# Patient Record
Sex: Female | Born: 1935 | Race: White | Hispanic: No | Smoking: Never smoker
Health system: Southern US, Community
[De-identification: ages and names within clinical notes are randomized; demographics above are authoritative.]

## PROBLEM LIST (undated history)

## (undated) DIAGNOSIS — E119 Type 2 diabetes mellitus without complications: Secondary | ICD-10-CM

## (undated) DIAGNOSIS — E785 Hyperlipidemia, unspecified: Secondary | ICD-10-CM

## (undated) DIAGNOSIS — C439 Malignant melanoma of skin, unspecified: Secondary | ICD-10-CM

## (undated) DIAGNOSIS — H269 Unspecified cataract: Secondary | ICD-10-CM

## (undated) HISTORY — DX: Hyperlipidemia, unspecified: E78.5

## (undated) HISTORY — DX: Malignant melanoma of skin, unspecified: C43.9

## (undated) HISTORY — DX: Type 2 diabetes mellitus without complications: E11.9

## (undated) HISTORY — DX: Unspecified cataract: H26.9

---

## 2014-08-12 HISTORY — PX: CATARACT EXTRACTION: SUR2

## 2014-10-11 DIAGNOSIS — H2513 Age-related nuclear cataract, bilateral: Secondary | ICD-10-CM | POA: Diagnosis not present

## 2014-10-11 DIAGNOSIS — H40033 Anatomical narrow angle, bilateral: Secondary | ICD-10-CM | POA: Diagnosis not present

## 2014-10-25 DIAGNOSIS — H25813 Combined forms of age-related cataract, bilateral: Secondary | ICD-10-CM | POA: Diagnosis not present

## 2014-10-31 DIAGNOSIS — H25811 Combined forms of age-related cataract, right eye: Secondary | ICD-10-CM | POA: Diagnosis not present

## 2014-10-31 DIAGNOSIS — H2513 Age-related nuclear cataract, bilateral: Secondary | ICD-10-CM | POA: Diagnosis not present

## 2014-10-31 DIAGNOSIS — H2511 Age-related nuclear cataract, right eye: Secondary | ICD-10-CM | POA: Diagnosis not present

## 2014-10-31 DIAGNOSIS — Z961 Presence of intraocular lens: Secondary | ICD-10-CM | POA: Diagnosis not present

## 2014-11-28 DIAGNOSIS — H2512 Age-related nuclear cataract, left eye: Secondary | ICD-10-CM | POA: Diagnosis not present

## 2014-11-28 DIAGNOSIS — H2513 Age-related nuclear cataract, bilateral: Secondary | ICD-10-CM | POA: Diagnosis not present

## 2014-11-28 DIAGNOSIS — Z961 Presence of intraocular lens: Secondary | ICD-10-CM | POA: Diagnosis not present

## 2014-11-28 DIAGNOSIS — H25812 Combined forms of age-related cataract, left eye: Secondary | ICD-10-CM | POA: Diagnosis not present

## 2014-12-27 DIAGNOSIS — H43393 Other vitreous opacities, bilateral: Secondary | ICD-10-CM | POA: Diagnosis not present

## 2015-01-12 ENCOUNTER — Ambulatory Visit (INDEPENDENT_AMBULATORY_CARE_PROVIDER_SITE_OTHER): Payer: Medicare Other | Admitting: Primary Care

## 2015-01-12 ENCOUNTER — Encounter (INDEPENDENT_AMBULATORY_CARE_PROVIDER_SITE_OTHER): Payer: Self-pay

## 2015-01-12 ENCOUNTER — Encounter: Payer: Self-pay | Admitting: Primary Care

## 2015-01-12 VITALS — BP 126/72 | HR 74 | Temp 98.1°F | Ht 64.5 in | Wt 171.8 lb

## 2015-01-12 DIAGNOSIS — L989 Disorder of the skin and subcutaneous tissue, unspecified: Secondary | ICD-10-CM | POA: Insufficient documentation

## 2015-01-12 NOTE — Progress Notes (Signed)
   Subjective:    Patient ID: Diane Ali, female    DOB: 08/05/1934, 81 y.o.   MRN: [id number]  HPI  Diane Ali is a 81 year old female who presents today to establish care and discuss the problems mentioned below. She has not been to a PCP in over 10 years therefore does not have a place to obtain old records.  1) Lesion to nose: Present to the left side of her nasal septum for the past 6 months. Initially presented as a sore spot to the nasal septum then gradually scabbed over. The scab will fall off and will regrow. Over the past 6 months her scab has not improved but has not worsened. She has some tenderness when applying pressure. She's tried applying neosporin, alcohol, and some "silver" product without resolve. Her scab does not itch. She is a non smoker. 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     Review of Systems  HENT: Negative for rhinorrhea.   Respiratory: Negative for cough and shortness of breath.   Cardiovascular: Negative for chest pain.  Skin: Positive for wound.  Neurological: Negative for dizziness.       Past Medical History  Diagnosis Date  . Cataracts, bilateral     History   Social History  . Marital Status: Widowed    Spouse Name: N/A  . Number of Children: N/A  . Years of Education: N/A   Occupational History  . Not on file.   Social History Main Topics  . Smoking status: Never Smoker   . Smokeless tobacco: Not on file  . Alcohol Use: No  . Drug Use: Not on file  . Sexual Activity: Not on file   Other Topics Concern  . Not on file   Social History Narrative   Widow   Retired. Once worked in [employer].   Enjoys reading, watching TV, being with her family.   Lives alone.    Past Surgical History  Procedure Laterality Date  . Cataract extraction  2016    Family History  Problem Relation Age of Onset  . Diabetes Father   . Alzheimer's disease Brother     No Known Allergies  No current outpatient  prescriptions on file prior to visit.   No current facility-administered medications on file prior to visit.    BP 126/72 mmHg  Pulse 74  Temp(Src) 98.1 F (36.7 C) (Oral)  Ht 5' 4.5" (1.638 m)  Wt 171 lb 12.8 oz (77.928 kg)  BMI 29.04 kg/m2  SpO2 96%    Objective:   Physical Exam  Constitutional: She appears well-nourished.  Cardiovascular: Normal rate and regular rhythm.   Pulmonary/Chest: Effort normal and breath sounds normal.  Skin: Skin is warm and dry.  1.5 cm lesion present to left side of nasal septum. Dark, scabbing present. Non-tender.          Assessment & Plan:

## 2015-01-12 NOTE — Progress Notes (Signed)
Pre visit review using our clinic review tool, if applicable. No additional management support is needed unless otherwise documented below in the visit note. 

## 2015-01-12 NOTE — Patient Instructions (Signed)
You will be contacted regarding your referral to Dermatology.  Please let us know if you have not heard back within one week.  Please schedule a physical with me in the next 1-2 months. You will also schedule a lab only appointment one week prior. We will discuss your lab results during your physical.  It was a pleasure to meet you today! Please don't hesitate to call me with any questions. Welcome to [facility]!

## 2015-01-12 NOTE — Assessment & Plan Note (Signed)
Present to left sided of nasal septum x 6 months Dark, scabbing. Suspicious for basal cell carcinoma.  Referral made to dermatology for further evaluation. Will continue to monitor.

## 2015-02-07 ENCOUNTER — Other Ambulatory Visit: Payer: Self-pay | Admitting: Primary Care

## 2015-02-07 DIAGNOSIS — Z0000 Encounter for general adult medical examination without abnormal findings: Secondary | ICD-10-CM

## 2015-02-15 ENCOUNTER — Other Ambulatory Visit (INDEPENDENT_AMBULATORY_CARE_PROVIDER_SITE_OTHER): Payer: Medicare Other

## 2015-02-15 DIAGNOSIS — Z0000 Encounter for general adult medical examination without abnormal findings: Secondary | ICD-10-CM

## 2015-02-15 DIAGNOSIS — E785 Hyperlipidemia, unspecified: Secondary | ICD-10-CM

## 2015-02-15 DIAGNOSIS — E559 Vitamin D deficiency, unspecified: Secondary | ICD-10-CM

## 2015-02-15 LAB — COMPREHENSIVE METABOLIC PANEL
ALK PHOS: 47 U/L (ref 39–117)
ALT: 11 U/L (ref 0–35)
AST: 17 U/L (ref 0–37)
Albumin: 3.8 g/dL (ref 3.5–5.2)
BILIRUBIN TOTAL: 0.7 mg/dL (ref 0.2–1.2)
BUN: 13 mg/dL (ref 6–23)
CO2: 28 mEq/L (ref 19–32)
CREATININE: 1.11 mg/dL (ref 0.40–1.20)
Calcium: 9.3 mg/dL (ref 8.4–10.5)
Chloride: 106 mEq/L (ref 96–112)
GFR: 50.44 mL/min — ABNORMAL LOW (ref >60.00)
GLUCOSE: 130 mg/dL — AB (ref 70–99)
Potassium: 3.8 mEq/L (ref 3.5–5.1)
Sodium: 141 mEq/L (ref 135–145)
Total Protein: 6.8 g/dL (ref 6.0–8.3)

## 2015-02-15 LAB — VITAMIN D 25 HYDROXY (VIT D DEFICIENCY, FRACTURES): VITD: 8.18 ng/mL — ABNORMAL LOW (ref 30.00–100.00)

## 2015-02-15 LAB — LIPID PANEL
Cholesterol: 200 mg/dL (ref 0–200)
HDL: 37.6 mg/dL — AB (ref >39.00)
LDL Cholesterol: 141 mg/dL — ABNORMAL HIGH (ref 0–99)
NonHDL: 162.4
TRIGLYCERIDES: 109 mg/dL (ref 0.0–149.0)
Total CHOL/HDL Ratio: 5
VLDL: 21.8 mg/dL (ref 0.0–40.0)

## 2015-02-15 LAB — HEMOGLOBIN A1C: HEMOGLOBIN A1C: 6.9 % — AB (ref 4.6–6.5)

## 2015-02-15 LAB — TSH: TSH: 4.53 u[IU]/mL — ABNORMAL HIGH (ref 0.35–4.50)

## 2015-02-16 LAB — CBC
HCT: 37.4 % (ref 36.0–46.0)
Hemoglobin: 12.5 g/dL (ref 12.0–15.0)
MCHC: 33.5 g/dL (ref 30.0–36.0)
MCV: 91.5 fl (ref 78.0–100.0)
PLATELETS: 181 10*3/uL (ref 150.0–400.0)
RBC: 4.09 Mil/uL (ref 3.87–5.11)
RDW: 13.1 % (ref 11.5–15.5)
WBC: 5.1 10*3/uL (ref 4.0–10.5)

## 2015-02-21 ENCOUNTER — Encounter: Payer: Self-pay | Admitting: Primary Care

## 2015-02-21 ENCOUNTER — Ambulatory Visit (INDEPENDENT_AMBULATORY_CARE_PROVIDER_SITE_OTHER): Payer: Medicare Other | Admitting: Primary Care

## 2015-02-21 VITALS — Ht 65.0 in | Wt 173.4 lb

## 2015-02-21 DIAGNOSIS — E039 Hypothyroidism, unspecified: Secondary | ICD-10-CM | POA: Diagnosis not present

## 2015-02-21 DIAGNOSIS — E119 Type 2 diabetes mellitus without complications: Secondary | ICD-10-CM

## 2015-02-21 DIAGNOSIS — E559 Vitamin D deficiency, unspecified: Secondary | ICD-10-CM | POA: Diagnosis not present

## 2015-02-21 DIAGNOSIS — Z23 Encounter for immunization: Secondary | ICD-10-CM

## 2015-02-21 DIAGNOSIS — E785 Hyperlipidemia, unspecified: Secondary | ICD-10-CM | POA: Diagnosis not present

## 2015-02-21 DIAGNOSIS — L989 Disorder of the skin and subcutaneous tissue, unspecified: Secondary | ICD-10-CM

## 2015-02-21 DIAGNOSIS — Z0000 Encounter for general adult medical examination without abnormal findings: Secondary | ICD-10-CM

## 2015-02-21 MED ORDER — VITAMIN D (ERGOCALCIFEROL) 1.25 MG (50000 UNIT) PO CAPS: ORAL_CAPSULE | ORAL | Status: DC

## 2015-02-21 MED ORDER — ATORVASTATIN CALCIUM 10 MG PO TABS: 10.0000 mg | ORAL_TABLET | Freq: Every day | ORAL | Status: DC

## 2015-02-21 MED ORDER — LEVOTHYROXINE SODIUM 25 MCG PO TABS: 25.0000 ug | ORAL_TABLET | Freq: Every day | ORAL | Status: DC

## 2015-02-21 NOTE — Assessment & Plan Note (Signed)
New diagnosis today. A1C 6.9. Due to age and level will continue to control with education regarding diet. Goal A1C for this patient is <8. Foot exam next visit. Repeat A1C in 3 months.

## 2015-02-21 NOTE — Assessment & Plan Note (Signed)
TSH 4.5 with symptoms of fatigue and memory loss. Will treat with low dose levothyroxine. Will repeat in 3 months during next visit.

## 2015-02-21 NOTE — Assessment & Plan Note (Signed)
Carcinoma to left nares. Following with dermatology currently. Is scheduled for removal next month.

## 2015-02-21 NOTE — Patient Instructions (Addendum)
Underactive thyroid: Start Levothyroxine tablets for underactive thyroid. Take 1 tablet by mouth in the morning with a full glass of water. Do not eat for at least 1 hour after taking this medication.  High Cholesterol: Start Atorvastatin. Take 1 tablet by mouth every night.  Low Vitamin D: Start taking vitamin D capsules. Take 1 capsule by mouth once a week.  It is important that you improve your diet. Please limit carbohydrates in the form of white bread, rice, pasta, cakes, cookies, sugary drinks, etc. Increase your consumption of fresh fruits and vegetables. Be sure to drink plenty of water daily.  Follow up in 3 months for recheck of Diabetes and Thyroid. Call me if you need anything!

## 2015-02-21 NOTE — Progress Notes (Signed)
Pre visit review using our clinic review tool, if applicable. No additional management support is needed unless otherwise documented below in the visit note. 

## 2015-02-21 NOTE — Progress Notes (Signed)
Patient ID: Diane Ali, female   DOB: Feb 07, 1934, 81 y.o.   MRN: [id number]  HPI: [name] is a 81 year old female who presents today for subsequent medicare wellness visit.  Past Medical History  Diagnosis Date  . Cataracts, bilateral     No current outpatient prescriptions on file.   No current facility-administered medications for this visit.    No Known Allergies  Family History  Problem Relation Age of Onset  . Diabetes Father   . Alzheimer's disease Brother     History   Social History  . Marital Status: Widowed    Spouse Name: N/A  . Number of Children: N/A  . Years of Education: N/A   Occupational History  . Not on file.   Social History Main Topics  . Smoking status: Never Smoker   . Smokeless tobacco: Not on file  . Alcohol Use: No  . Drug Use: Not on file  . Sexual Activity: Not on file   Other Topics Concern  . Not on file   Social History Narrative   Widow   Retired. Once worked in [employer].   Enjoys reading, watching TV, being with her family.   Lives alone.    Hospitiliaztions: None.  Health Maintenance:    Flu: Did not receive last season.   Tetanus: Unsure. Has not received medication care in over 20 years.  Pneumovax: Has never completed.  Zostavax: Has never completed.  Bone Density: Has not completed.  Colonoscopy: Has never completed.  Eye Doctor: March 2016 for glaucoma testing.  Dental Exam: Has not been recently.    I have personally reviewed and have noted: 1. The patient's medical and social history 2. Their use of alcohol, tobacco or illicit drugs. None. 3. Their current medications and supplements 4. The patient's functional ability including ADL's, fall risks, home safety risks and hearing or visual  impairment. 5. Diet and physical activities 6. Evidence for depression or mood disorder  Subjective:   Review of Systems:   Constitutional: Denies fever, malaise, fatigue, headache or abrupt weight changes.   HEENT: Denies eye pain, eye redness, ear pain, ringing in the ears, wax buildup, runny nose, nasal congestion, bloody nose, or sore throat. Respiratory: Denies difficulty breathing, shortness of breath, cough or sputum production.   Cardiovascular: Denies chest pain, chest tightness, palpitations or swelling in the hands or feet.  Gastrointestinal: Denies abdominal pain, bloating, constipation, diarrhea or blood in the stool.  GU: Denies urgency, frequency, pain with urination, burning sensation, blood in urine, odor or discharge. Musculoskeletal: Denies decrease in range of motion, difficulty with gait, muscle pain or joint pain and swelling.  Skin: Denies redness, rashes. Lesion to left nares which is under current treatment. Neurological: Denies dizziness, difficulty with memory, difficulty with speech or problems with balance and coordination.   No other specific complaints in a complete review of systems (except as listed in HPI above).  Objective:  PE:   Ht 5\' 5"  (1.651 m)  Wt 173 lb 6.4 oz (78.654 kg)  BMI 28.86 kg/m2 Wt Readings from Last 3 Encounters:  02/21/15 173 lb 6.4 oz (78.654 kg)  01/12/15 171 lb 12.8 oz (77.928 kg)    General: Appears their stated age, well developed, well nourished in NAD. Skin: Warm, dry. Lesion to left nares which has been determined to be cancerous. She currently undergoing treatment with dermatology. HEENT: Head: normal shape and size; Eyes: sclera white, no icterus, conjunctiva pink, PERRLA and EOMs intact; Ears: Tm's  gray and intact, normal light reflex; Nose: mucosa pink and moist, septum midline; Throat/Mouth: Teeth present, mucosa pink and moist, no exudate, lesions or ulcerations noted.  Neck: Normal range of motion. Neck supple, trachea midline. No massses, lumps or thyromegaly present.  Cardiovascular: Normal rate and rhythm. S1,S2 noted.  No murmur, rubs or gallops noted. No JVD or BLE edema. No carotid bruits noted. Pulmonary/Chest:  Normal effort and positive vesicular breath sounds. No respiratory distress. No wheezes, rales or ronchi noted.  Abdomen: Soft and nontender. Normal bowel sounds, no bruits noted. No distention or masses noted. Liver, spleen and kidneys non palpable. Musculoskeletal: Normal range of motion. No signs of joint swelling. No difficulty with gait.  Neurological: Alert and oriented. Cranial nerves II-XII intact. Coordination normal. +DTRs bilaterally. Psychiatric: Mood and affect normal. Behavior is normal. Judgment and thought content normal.   EKG:   BMET    Component Value Date/Time   NA 141 02/15/2015 1202   K 3.8 02/15/2015 1202   CL 106 02/15/2015 1202   CO2 28 02/15/2015 1202   GLUCOSE 130* 02/15/2015 1202   BUN 13 02/15/2015 1202   CREATININE 1.11 02/15/2015 1202   CALCIUM 9.3 02/15/2015 1202    Lipid Panel     Component Value Date/Time   CHOL 200 02/15/2015 1202   TRIG 109.0 02/15/2015 1202   HDL 37.60* 02/15/2015 1202   CHOLHDL 5 02/15/2015 1202   VLDL 21.8 02/15/2015 1202   LDLCALC 141* 02/15/2015 1202    CBC    Component Value Date/Time   WBC 5.1 02/15/2015 1202   RBC 4.09 02/15/2015 1202   HGB 12.5 02/15/2015 1202   HCT 37.4 02/15/2015 1202   PLT 181.0 02/15/2015 1202   MCV 91.5 02/15/2015 1202   MCHC 33.5 02/15/2015 1202   RDW 13.1 02/15/2015 1202    Hgb A1C Lab Results  Component Value Date   HGBA1C 6.9* 02/15/2015      Assessment and Plan:   Medicare Annual Wellness Visit:  Diet: Consists of sandwiches, TV dinners, cookies, ice cream. Limited fruits and vegetables. Drinking mostly diet sprite, water.  Physical activity: Active around her house. Depression/mood screen: Negative Hearing: Intact to whispered voice Visual acuity: Grossly normal, performs annual eye exam  ADLs: Capable Fall risk: None Home safety: Good Cognitive evaluation: Intact to orientation, naming, recall and repetition EOL planning: Has not discussed with family. Packet  provided.  Preventative Medicine: Tdap and Prevnar vaccines provided today. Will provide shingles vaccine next visit in 3 months. Bone density testing ordered. Due to age will not have patient undergo colonoscopy. Discussed diabetic diet and to reduce carbohydrates and sugars. Provided patient and family a list of recommendations for preventative treatment.  Next appointment: 3 months for re-evaluation of diabetes and thyroid function.   Providers: Alma Friendly, AGNP-C. PCP. Dermatology.

## 2015-02-21 NOTE — Assessment & Plan Note (Signed)
LDL 141 which is above goal, especially given the diabetes. Will treat with moderate intensity statin. Will recheck in 3 months.

## 2015-02-21 NOTE — Assessment & Plan Note (Signed)
Start prescription weekly vitamin d. Will recheck in 12 weeks. Dexa scan ordered.

## 2015-02-21 NOTE — Assessment & Plan Note (Signed)
Tdap and Prevnar provided today. Zostavax due next visit. Discussed importance of healthy diet due to elevated cholesterol and diabetes diagnosis. No falls or hospitalizations. Lives at home by herself. Packet provided regarding advance directives.

## 2015-03-01 ENCOUNTER — Ambulatory Visit
Admission: RE | Admit: 2015-03-01 | Discharge: 2015-03-01 | Disposition: A | Discharge status: Home / Self Care | Payer: Medicare Other | Source: Ambulatory Visit | Attending: Primary Care | Admitting: Primary Care

## 2015-03-01 ENCOUNTER — Telehealth: Payer: Self-pay | Admitting: Primary Care

## 2015-03-01 DIAGNOSIS — E559 Vitamin D deficiency, unspecified: Secondary | ICD-10-CM | POA: Diagnosis present

## 2015-03-01 DIAGNOSIS — Z780 Asymptomatic menopausal state: Secondary | ICD-10-CM | POA: Insufficient documentation

## 2015-03-01 NOTE — Telephone Encounter (Signed)
Diane Ali returned your call

## 2015-03-01 NOTE — Telephone Encounter (Signed)
Called and notified patient's daughter of Kate's comments. Patient's daughter verbalized understanding. 

## 2015-04-03 ENCOUNTER — Emergency Department (HOSPITAL_COMMUNITY): Payer: Medicare Other

## 2015-04-03 ENCOUNTER — Encounter (HOSPITAL_COMMUNITY): Payer: Self-pay | Admitting: *Deleted

## 2015-04-03 ENCOUNTER — Emergency Department (HOSPITAL_COMMUNITY)
Admission: EM | Admit: 2015-04-03 | Discharge: 2015-04-04 | Disposition: A | Discharge status: Home / Self Care | Payer: Medicare Other | Attending: Emergency Medicine | Admitting: Emergency Medicine

## 2015-04-03 DIAGNOSIS — Z79899 Other long term (current) drug therapy: Secondary | ICD-10-CM | POA: Diagnosis not present

## 2015-04-03 DIAGNOSIS — E860 Dehydration: Secondary | ICD-10-CM | POA: Diagnosis not present

## 2015-04-03 DIAGNOSIS — M545 Low back pain: Secondary | ICD-10-CM | POA: Diagnosis not present

## 2015-04-03 DIAGNOSIS — R112 Nausea with vomiting, unspecified: Secondary | ICD-10-CM

## 2015-04-03 DIAGNOSIS — N289 Disorder of kidney and ureter, unspecified: Secondary | ICD-10-CM

## 2015-04-03 DIAGNOSIS — G8929 Other chronic pain: Secondary | ICD-10-CM | POA: Diagnosis not present

## 2015-04-03 DIAGNOSIS — M25552 Pain in left hip: Secondary | ICD-10-CM | POA: Insufficient documentation

## 2015-04-03 LAB — COMPREHENSIVE METABOLIC PANEL
ALK PHOS: 49 U/L (ref 38–126)
ALT: 16 U/L (ref 14–54)
AST: 25 U/L (ref 15–41)
Albumin: 4.1 g/dL (ref 3.5–5.0)
Anion gap: 9 (ref 5–15)
BUN: 27 mg/dL — ABNORMAL HIGH (ref 6–20)
CALCIUM: 9.8 mg/dL (ref 8.9–10.3)
CO2: 23 mmol/L (ref 22–32)
CREATININE: 1.84 mg/dL — AB (ref 0.44–1.00)
Chloride: 107 mmol/L (ref 101–111)
GFR calc non Af Amer: 25 mL/min — ABNORMAL LOW (ref >60)
GFR, EST AFRICAN AMERICAN: 29 mL/min — AB (ref >60)
Glucose, Bld: 168 mg/dL — ABNORMAL HIGH (ref 65–99)
Potassium: 4.7 mmol/L (ref 3.5–5.1)
SODIUM: 139 mmol/L (ref 135–145)
Total Bilirubin: 0.8 mg/dL (ref 0.3–1.2)
Total Protein: 7.4 g/dL (ref 6.5–8.1)

## 2015-04-03 LAB — CBC
HCT: 37.4 % (ref 36.0–46.0)
Hemoglobin: 12.6 g/dL (ref 12.0–15.0)
MCH: 30.6 pg (ref 26.0–34.0)
MCHC: 33.7 g/dL (ref 30.0–36.0)
MCV: 90.8 fL (ref 78.0–100.0)
PLATELETS: 186 10*3/uL (ref 150–400)
RBC: 4.12 MIL/uL (ref 3.87–5.11)
RDW: 13.3 % (ref 11.5–15.5)
WBC: 6.8 10*3/uL (ref 4.0–10.5)

## 2015-04-03 LAB — I-STAT VENOUS BLOOD GAS, ED
ACID-BASE DEFICIT: 2 mmol/L (ref 0.0–2.0)
Bicarbonate: 22.7 mEq/L (ref 20.0–24.0)
O2 Saturation: 44 %
PO2 VEN: 25 mmHg — AB (ref 30.0–45.0)
TCO2: 24 mmol/L (ref 0–100)
pCO2, Ven: 39.6 mmHg — ABNORMAL LOW (ref 45.0–50.0)
pH, Ven: 7.367 — ABNORMAL HIGH (ref 7.250–7.300)

## 2015-04-03 LAB — SALICYLATE LEVEL

## 2015-04-03 MED ORDER — SODIUM CHLORIDE 0.9 % IV BOLUS (SEPSIS)
1000.0000 mL | Freq: Once | INTRAVENOUS | Status: AC
  Administered 2015-04-04: 1000 mL via INTRAVENOUS

## 2015-04-03 MED ORDER — SODIUM CHLORIDE 0.9 % IV BOLUS (SEPSIS)
500.0000 mL | Freq: Once | INTRAVENOUS | Status: AC
  Administered 2015-04-04: 500 mL via INTRAVENOUS

## 2015-04-03 NOTE — ED Notes (Addendum)
Pt family reports that pt has taken 12 advil since Saturday night. Pt reports having nausea and vomiting. Family concerned with the amount that she took. Pt reports left hip pain. Pt denies falls or injuries. Pt family reports some confusion at baseline.

## 2015-04-03 NOTE — ED Provider Notes (Signed)
CSN: [id number]   Arrival date & time 04/03/15 8101  History  This chart was scribed for Ripley Fraise, MD by Altamease Oiler, ED Scribe. This patient was seen in room B14C/B14C and the patient's care was started at 11:16 PM.  Chief Complaint  Patient presents with  . Emesis    HPI Patient is a 81 y.o. female presenting with vomiting. The history is provided by the patient and a relative. No language interpreter was used.  Emesis Severity:  Moderate Duration:  1 day Timing:  Intermittent Quality:  Stomach contents Progression:  Unchanged Chronicity:  New Recent urination:  Normal Context: not post-tussive and not self-induced   Relieved by:  Nothing Worsened by:  Nothing tried Ineffective treatments:  None tried Associated symptoms: arthralgias (chronic left hip)   Associated symptoms: no abdominal pain and no headaches     Diane Ali is a 81 y.o. female who presents to the Emergency Department complaining of nausea and  emesis with  onset yesterday. Her daughter associates the symptoms with use of Advil since nose surgery. Over the previous 2 days the pt used 12 Advil tablets (last dose around 5 PM yesterday) and today she used 1 dose of Tylenol for pain. Also complains of chronic back and left hip pain. Pt denies fever, nasal pain, headache, chest pain, abdominal pain, dysuria, diarrhea, LE pain, recent fall, hematemesis, and hematochezia .   Past Medical History  Diagnosis Date  . Cataracts, bilateral     Past Surgical History  Procedure Laterality Date  . Cataract extraction  2016    Family History  Problem Relation Age of Onset  . Diabetes Father   . Alzheimer's disease Brother     Social History  Substance Use Topics  . Smoking status: Never Smoker   . Smokeless tobacco: None  . Alcohol Use: No     Review of Systems  Constitutional: Negative for fever.  Cardiovascular: Negative for chest pain.  Gastrointestinal: Positive for vomiting. Negative for  abdominal pain and blood in stool.  Genitourinary: Negative for dysuria.  Musculoskeletal: Positive for back pain (chronic) and arthralgias (chronic left hip).  Neurological: Negative for headaches.  All other systems reviewed and are negative.   Home Medications   Prior to Admission medications   Medication Sig Start Date End Date Taking? Authorizing Provider  acetaminophen (TYLENOL) 500 MG tablet Take 1,000 mg by mouth every 6 (six) hours as needed for moderate pain.    Yes Historical Provider, MD  atorvastatin (LIPITOR) 10 MG tablet Take 1 tablet (10 mg total) by mouth daily. 02/21/15  Yes Pleas Koch, NP  ibuprofen (ADVIL,MOTRIN) 200 MG tablet Take 400 mg by mouth every 6 (six) hours as needed for moderate pain.    Yes Historical Provider, MD  levothyroxine (LEVOTHROID) 25 MCG tablet Take 1 tablet (25 mcg total) by mouth daily before breakfast. 02/21/15  Yes Pleas Koch, NP  Vitamin D, Ergocalciferol, (DRISDOL) 50000 UNITS CAPS capsule Take 1 capsule by mouth once weekly. 02/21/15  Yes Pleas Koch, NP    Allergies  Review of patient's allergies indicates no known allergies.  Triage Vitals: BP 157/75 mmHg  Pulse 72  Temp(Src) 98 F (36.7 C) (Oral)  Resp 16  SpO2 100% Physical Exam  Nursing note reviewed. CONSTITUTIONAL: Well developed/well nourished HEAD: Normocephalic/atraumatic EYES: EOMI/PERRL ENMT: Mucous membranes moist, healing surgical incision to nose with no erythema or drainage NECK: supple no meningeal signs SPINE/BACK:entire spine nontender CV: S1/S2 noted, no murmurs/rubs/gallops noted  LUNGS: Lungs are clear to auscultation bilaterally, no apparent distress ABDOMEN: soft, nontender, no rebound or guarding, bowel sounds noted throughout abdomen GU:no cva tenderness NEURO: Pt is awake/alert/appropriate, moves all extremitiesx4.  No facial droop.   EXTREMITIES: pulses normal/equal, full ROM, no deformity to lower extremities, no tenderness with ROM  of hips SKIN: warm, color normal PSYCH: no abnormalities of mood noted, alert and oriented to situation   ED Course  Procedures   COORDINATION OF CARE: 11:24 PM Discussed treatment plan which includes lab work, left hip XR, and IVF with pt at bedside and pt agreed to plan.  1:38 AM Pt improved Mild renal insufficiency Advised stopping NSAIDs Increase fluids Recheck CR in one week with PCP Pt is at baseline Had some left hip pain but xray negative and she is reporting she is ambulating I feel she is safe for d/c home  Labs Reviewed  COMPREHENSIVE METABOLIC PANEL - Abnormal; Notable for the following:    Glucose, Bld 168 (*)    BUN 27 (*)    Creatinine, Ser 1.84 (*)    GFR calc non Af Amer 25 (*)    GFR calc Af Amer 29 (*)    All other components within normal limits  ACETAMINOPHEN LEVEL - Abnormal; Notable for the following:    Acetaminophen (Tylenol), Serum <10 (*)    All other components within normal limits  I-STAT VENOUS BLOOD GAS, ED - Abnormal; Notable for the following:    pH, Ven 7.367 (*)    pCO2, Ven 39.6 (*)    pO2, Ven 25.0 (*)    All other components within normal limits  CBC  SALICYLATE LEVEL  BLOOD GAS, VENOUS    Imaging Review Dg Hip Unilat With Pelvis 2-3 Views Left  04/03/2015   CLINICAL DATA:  Left hip pain, low back pain  EXAM: DG HIP (WITH OR WITHOUT PELVIS) 2-3V LEFT  COMPARISON:  None.  FINDINGS: Three views of the left hip submitted. No acute fracture or subluxation. Mild degenerative changes with spurring of greater femoral trochanter. Degenerative changes pubic symphysis. Degenerative changes bilateral SI joints.  IMPRESSION: No acute fracture or subluxation. Degenerative changes as described above   Electronically Signed   By: Lahoma Crocker M.D.   On: 04/03/2015 19:53   I, Ripley Fraise, MD, personally reviewed and evaluated these images and lab results as part of my medical decision-making.    EKG Interpretation  Date/Time:  Monday April 03 2015 23:46:14 EDT Ventricular Rate:  66 PR Interval:  133 QRS Duration: 78 QT Interval:  410 QTC Calculation: 430 R Axis:   -39 Text Interpretation:  Sinus rhythm Left axis deviation Low voltage, precordial leads Baseline wander in lead(s) I No previous ECGs available Confirmed by Christy Gentles  MD, Elenore Rota ([id number]) on 04/04/2015 12:11:15 AM            MDM   Final diagnoses:  Non-intractable vomiting with nausea, vomiting of unspecified type  Dehydration  Renal insufficiency  Pain in left hip     Nursing notes including past medical history and social history reviewed and considered in documentation xrays/imaging reviewed by myself and considered during evaluation Labs/vital reviewed myself and considered during evaluation   I personally performed the services described in this documentation, which was scribed in my presence. The recorded information has been reviewed and is accurate.     Ripley Fraise, MD 04/04/15 [phone]

## 2015-04-04 ENCOUNTER — Telehealth: Payer: Self-pay

## 2015-04-04 LAB — ACETAMINOPHEN LEVEL

## 2015-04-04 NOTE — Telephone Encounter (Signed)
Noted. Will discuss at upcoming appointment.

## 2015-04-04 NOTE — ED Notes (Signed)
Discharge instructions provided to patient/daughter. Understanding verbalized. Denies pain. Patient refused wheelchair stating "I want to walk out." Daughter reports patient is able to ambulate independently. Daughter with patient at discharge. No distress noted.

## 2015-04-04 NOTE — Telephone Encounter (Signed)
Chrys Racer pts daughter (DPR signed) left v/m; Chrys Racer is concerned that pt needs to be placed in a facility (assisted living?); Chrys Racer does not think pt is taking med correctly; pts memory is worse when pt gets anxious. Chrys Racer said putting pts med in daily med box but pt still has access to OTC meds such as Advil and pt has constant hip pain. Chrys Racer wants to know how to start looking for a place for her mother to stay. Spoke with Chrys Racer and advised usually family members ck with different facilities and see where family and pt would be comfortable with pts residing. Then would get paperwork from that facility. Chrys Racer wants to know if ins will pay for facility and advised Chrys Racer to contact pts ins co for coverage guidelines. Chrys Racer voiced understanding and will cb if needed prior to 30 min appt that is already scheduled for 04/10/15 for F/U ED visit. Chrys Racer asked this note go to [name] NP as Juluis Rainier.

## 2015-04-04 NOTE — Discharge Instructions (Signed)

## 2015-04-05 ENCOUNTER — Emergency Department (HOSPITAL_COMMUNITY)
Admission: EM | Admit: 2015-04-05 | Discharge: 2015-04-05 | Disposition: A | Discharge status: Home / Self Care | Payer: Medicare Other | Attending: Emergency Medicine | Admitting: Emergency Medicine

## 2015-04-05 ENCOUNTER — Emergency Department (HOSPITAL_COMMUNITY): Payer: Medicare Other

## 2015-04-05 ENCOUNTER — Encounter (HOSPITAL_COMMUNITY): Payer: Self-pay | Admitting: Emergency Medicine

## 2015-04-05 DIAGNOSIS — R109 Unspecified abdominal pain: Secondary | ICD-10-CM

## 2015-04-05 DIAGNOSIS — Z8669 Personal history of other diseases of the nervous system and sense organs: Secondary | ICD-10-CM | POA: Insufficient documentation

## 2015-04-05 DIAGNOSIS — Z79899 Other long term (current) drug therapy: Secondary | ICD-10-CM | POA: Insufficient documentation

## 2015-04-05 DIAGNOSIS — N390 Urinary tract infection, site not specified: Secondary | ICD-10-CM | POA: Diagnosis not present

## 2015-04-05 DIAGNOSIS — N200 Calculus of kidney: Secondary | ICD-10-CM

## 2015-04-05 DIAGNOSIS — R10A0 Flank pain, unspecified side: Secondary | ICD-10-CM

## 2015-04-05 LAB — COMPREHENSIVE METABOLIC PANEL
ALBUMIN: 3.6 g/dL (ref 3.5–5.0)
ALK PHOS: 46 U/L (ref 38–126)
ALT: 13 U/L — AB (ref 14–54)
ANION GAP: 9 (ref 5–15)
AST: 19 U/L (ref 15–41)
BILIRUBIN TOTAL: 0.6 mg/dL (ref 0.3–1.2)
BUN: 23 mg/dL — ABNORMAL HIGH (ref 6–20)
CALCIUM: 9.3 mg/dL (ref 8.9–10.3)
CO2: 22 mmol/L (ref 22–32)
CREATININE: 1.34 mg/dL — AB (ref 0.44–1.00)
Chloride: 105 mmol/L (ref 101–111)
GFR calc Af Amer: 43 mL/min — ABNORMAL LOW (ref >60)
GFR calc non Af Amer: 37 mL/min — ABNORMAL LOW (ref >60)
GLUCOSE: 173 mg/dL — AB (ref 65–99)
Potassium: 4.5 mmol/L (ref 3.5–5.1)
Sodium: 136 mmol/L (ref 135–145)
TOTAL PROTEIN: 6.3 g/dL — AB (ref 6.5–8.1)

## 2015-04-05 LAB — URINE MICROSCOPIC-ADD ON

## 2015-04-05 LAB — CBC WITH DIFFERENTIAL/PLATELET
BASOS PCT: 0 % (ref 0–1)
Basophils Absolute: 0 10*3/uL (ref 0.0–0.1)
Eosinophils Absolute: 0 10*3/uL (ref 0.0–0.7)
Eosinophils Relative: 0 % (ref 0–5)
HEMATOCRIT: 34.8 % — AB (ref 36.0–46.0)
HEMOGLOBIN: 11.5 g/dL — AB (ref 12.0–15.0)
LYMPHS ABS: 1.7 10*3/uL (ref 0.7–4.0)
Lymphocytes Relative: 22 % (ref 12–46)
MCH: 29.9 pg (ref 26.0–34.0)
MCHC: 33 g/dL (ref 30.0–36.0)
MCV: 90.4 fL (ref 78.0–100.0)
MONOS PCT: 7 % (ref 3–12)
Monocytes Absolute: 0.5 10*3/uL (ref 0.1–1.0)
NEUTROS ABS: 5.6 10*3/uL (ref 1.7–7.7)
NEUTROS PCT: 71 % (ref 43–77)
Platelets: 194 10*3/uL (ref 150–400)
RBC: 3.85 MIL/uL — AB (ref 3.87–5.11)
RDW: 13.3 % (ref 11.5–15.5)
WBC: 7.9 10*3/uL (ref 4.0–10.5)

## 2015-04-05 LAB — URINALYSIS, ROUTINE W REFLEX MICROSCOPIC
BILIRUBIN URINE: NEGATIVE
GLUCOSE, UA: NEGATIVE mg/dL
KETONES UR: NEGATIVE mg/dL
Nitrite: NEGATIVE
PROTEIN: 100 mg/dL — AB
Specific Gravity, Urine: 1.022 (ref 1.005–1.030)
Urobilinogen, UA: 0.2 mg/dL (ref 0.0–1.0)
pH: 5.5 (ref 5.0–8.0)

## 2015-04-05 MED ORDER — DEXTROSE 5 % IV SOLN
1.0000 g | Freq: Once | INTRAVENOUS | Status: AC
  Administered 2015-04-05: 1 g via INTRAVENOUS
  Filled 2015-04-05: qty 10

## 2015-04-05 MED ORDER — SODIUM CHLORIDE 0.9 % IV BOLUS (SEPSIS)
1000.0000 mL | Freq: Once | INTRAVENOUS | Status: AC
Start: 2015-04-05 — End: 2015-04-05
  Administered 2015-04-05: 1000 mL via INTRAVENOUS

## 2015-04-05 MED ORDER — CEPHALEXIN 500 MG PO CAPS: 500.0000 mg | ORAL_CAPSULE | Freq: Two times a day (BID) | ORAL | Status: DC

## 2015-04-05 MED ORDER — OXYCODONE-ACETAMINOPHEN 5-325 MG PO TABS: 1.0000 | ORAL_TABLET | Freq: Four times a day (QID) | ORAL | Status: DC | PRN

## 2015-04-05 NOTE — ED Notes (Signed)
Family member sts she noticed pt urine was cloudy yesterday

## 2015-04-05 NOTE — ED Notes (Signed)
Pt seen yesterday for nv. Today pt c/o L flank and L abdominal pain. Pt with hx of dementia and has had kidney stones in the past.

## 2015-04-05 NOTE — Discharge Instructions (Signed)
You were seen today for back and abdominal pain. It looks as though you may have recently passed kidney stone. You also have evidence of a urinary tract infection. He will be given antibiotics. You'll be discharged with pain medicines and antibiotics. You should follow-up with her primary doctor. You'll be given urology referral if needed.  Urinary Tract Infection Urinary tract infections (UTIs) can develop anywhere along your urinary tract. Your urinary tract is your body's drainage system for removing wastes and extra water. Your urinary tract includes two kidneys, two ureters, a bladder, and a urethra. Your kidneys are a pair of bean-shaped organs. Each kidney is about the size of your fist. They are located below your ribs, one on each side of your spine. CAUSES Infections are caused by microbes, which are microscopic organisms, including fungi, viruses, and bacteria. These organisms are so small that they can only be seen through a microscope. Bacteria are the microbes that most commonly cause UTIs. SYMPTOMS  Symptoms of UTIs may vary by age and gender of the patient and by the location of the infection. Symptoms in young women typically include a frequent and intense urge to urinate and a painful, burning feeling in the bladder or urethra during urination. Older women and men are more likely to be tired, shaky, and weak and have muscle aches and abdominal pain. A fever may mean the infection is in your kidneys. Other symptoms of a kidney infection include pain in your back or sides below the ribs, nausea, and vomiting. DIAGNOSIS To diagnose a UTI, your caregiver will ask you about your symptoms. Your caregiver also will ask to provide a urine sample. The urine sample will be tested for bacteria and white blood cells. White blood cells are made by your body to help fight infection. TREATMENT  Typically, UTIs can be treated with medication. Because most UTIs are caused by a bacterial infection, they  usually can be treated with the use of antibiotics. The choice of antibiotic and length of treatment depend on your symptoms and the type of bacteria causing your infection. HOME CARE INSTRUCTIONS  If you were prescribed antibiotics, take them exactly as your caregiver instructs you. Finish the medication even if you feel better after you have only taken some of the medication.  Drink enough water and fluids to keep your urine clear or pale yellow.  Avoid caffeine, tea, and carbonated beverages. They tend to irritate your bladder.  Empty your bladder often. Avoid holding urine for long periods of time.  Empty your bladder before and after sexual intercourse.  After a bowel movement, women should cleanse from front to back. Use each tissue only once. SEEK MEDICAL CARE IF:   You have back pain.  You develop a fever.  Your symptoms do not begin to resolve within 3 days. SEEK IMMEDIATE MEDICAL CARE IF:   You have severe back pain or lower abdominal pain.  You develop chills.  You have nausea or vomiting.  You have continued burning or discomfort with urination. MAKE SURE YOU:   Understand these instructions.  Will watch your condition.  Will get help right away if you are not doing well or get worse. Document Released: 05/08/2005 Document Revised: 01/28/2012 Document Reviewed: 09/06/2011 [facility] Patient Information 2015 [place], [place]. This information is not intended to replace advice given to you by your health care provider. Make sure you discuss any questions you have with your health care provider. Kidney Stones Kidney stones (urolithiasis) are deposits that form inside  your kidneys. The intense pain is caused by the stone moving through the urinary tract. When the stone moves, the ureter goes into spasm around the stone. The stone is usually passed in the urine.  CAUSES   A disorder that makes certain neck glands produce too much parathyroid hormone (primary  hyperparathyroidism).  A buildup of uric acid crystals, similar to gout in your joints.  Narrowing (stricture) of the ureter.  A kidney obstruction present at birth (congenital obstruction).  Previous surgery on the kidney or ureters.  Numerous kidney infections. SYMPTOMS   Feeling sick to your stomach (nauseous).  Throwing up (vomiting).  Blood in the urine (hematuria).  Pain that usually spreads (radiates) to the groin.  Frequency or urgency of urination. DIAGNOSIS   Taking a history and physical exam.  Blood or urine tests.  CT scan.  Occasionally, an examination of the inside of the urinary bladder (cystoscopy) is performed. TREATMENT   Observation.  Increasing your fluid intake.  Extracorporeal shock wave lithotripsy--This is a noninvasive procedure that uses shock waves to break up kidney stones.  Surgery may be needed if you have severe pain or persistent obstruction. There are various surgical procedures. Most of the procedures are performed with the use of small instruments. Only small incisions are needed to accommodate these instruments, so recovery time is minimized. The size, location, and chemical composition are all important variables that will determine the proper choice of action for you. Talk to your health care provider to better understand your situation so that you will minimize the risk of injury to yourself and your kidney.  HOME CARE INSTRUCTIONS   Drink enough water and fluids to keep your urine clear or pale yellow. This will help you to pass the stone or stone fragments.  Strain all urine through the provided strainer. Keep all particulate matter and stones for your health care provider to see. The stone causing the pain may be as small as a grain of salt. It is very important to use the strainer each and every time you pass your urine. The collection of your stone will allow your health care provider to analyze it and verify that a stone has  actually passed. The stone analysis will often identify what you can do to reduce the incidence of recurrences.  Only take over-the-counter or prescription medicines for pain, discomfort, or fever as directed by your health care provider.  Make a follow-up appointment with your health care provider as directed.  Get follow-up X-rays if required. The absence of pain does not always mean that the stone has passed. It may have only stopped moving. If the urine remains completely obstructed, it can cause loss of kidney function or even complete destruction of the kidney. It is your responsibility to make sure X-rays and follow-ups are completed. Ultrasounds of the kidney can show blockages and the status of the kidney. Ultrasounds are not associated with any radiation and can be performed easily in a matter of minutes. SEEK MEDICAL CARE IF:  You experience pain that is progressive and unresponsive to any pain medicine you have been prescribed. SEEK IMMEDIATE MEDICAL CARE IF:   Pain cannot be controlled with the prescribed medicine.  You have a fever or shaking chills.  The severity or intensity of pain increases over 18 hours and is not relieved by pain medicine.  You develop a new onset of abdominal pain.  You feel faint or pass out.  You are unable to urinate. MAKE SURE YOU:  Understand these instructions.  Will watch your condition.  Will get help right away if you are not doing well or get worse. Document Released: 07/29/2005 Document Revised: 03/31/2013 Document Reviewed: 12/30/2012 [facility] Patient Information 2015 [place], [place]. This information is not intended to replace advice given to you by your health care provider. Make sure you discuss any questions you have with your health care provider.

## 2015-04-05 NOTE — ED Provider Notes (Signed)
Patient brought back prescription for oxycodone-acetaminophen. Was not able to be filled because it was not signed. Unsigned prescription was taken and she is given a new prescription for oxycodone-acetaminophen 5-325 #10.  Delora Fuel, MD [phone]

## 2015-04-05 NOTE — ED Provider Notes (Signed)
CSN: [id number]     Arrival date & time 04/05/15  0013 History  This chart was scribed for Diane Hacker, MD by Meriel Pica, ED Scribe. This patient was seen in room A03C/A03C and the patient's care was started 3:10 AM.   Chief Complaint  Patient presents with  . Flank Pain   The history is provided by the patient and a relative. No language interpreter was used.   HPI Comments: Diane Ali is a 81 y.o. female, with a PMhx of dementia and renal calculi, who presents to the Emergency Department complaining of sudden onset, intermittent, moderate left-sided abdominal pain that radiates to her left flank onset today. Daughter reports pt has also been mildly nauseous today. The pt was evaluated in the ED 1 day ago for nausea and vomiting that began 3 days ago. She stated yesterday that she has been heavily taking Advil for her additional complaint of chronic back pain and left hip pain. The pt had unremarkable imaging but labs showed mild renal insufficiency on this visit. She was stable for discharge and advised to discontinue use of NSAIDs. Denies right-sided pain, changes in bowel or bladder, urinary symptoms, vomiting since last ED visit, or fevers.   Past Medical History  Diagnosis Date  . Cataracts, bilateral    Past Surgical History  Procedure Laterality Date  . Cataract extraction  2016   Family History  Problem Relation Age of Onset  . Diabetes Father   . Alzheimer's disease Brother    Social History  Substance Use Topics  . Smoking status: Never Smoker   . Smokeless tobacco: None  . Alcohol Use: No   OB History    No data available     Review of Systems  Constitutional: Negative for fever.  Respiratory: Negative for chest tightness and shortness of breath.   Cardiovascular: Negative for chest pain.  Gastrointestinal: Positive for nausea and abdominal pain ( left-sided ). Negative for vomiting.  Genitourinary: Positive for flank pain ( left). Negative for  dysuria, urgency, frequency and hematuria.  All other systems reviewed and are negative.  Allergies  Review of patient's allergies indicates no known allergies.  Home Medications   Prior to Admission medications   Medication Sig Start Date End Date Taking? Authorizing Provider  acetaminophen (TYLENOL) 500 MG tablet Take 1,000 mg by mouth every 6 (six) hours as needed for moderate pain.    Yes Historical Provider, MD  atorvastatin (LIPITOR) 10 MG tablet Take 1 tablet (10 mg total) by mouth daily. 02/21/15  Yes Pleas Koch, NP  ibuprofen (ADVIL,MOTRIN) 200 MG tablet Take 400 mg by mouth every 6 (six) hours as needed for moderate pain.    Yes Historical Provider, MD  levothyroxine (LEVOTHROID) 25 MCG tablet Take 1 tablet (25 mcg total) by mouth daily before breakfast. 02/21/15  Yes Pleas Koch, NP  Vitamin D, Ergocalciferol, (DRISDOL) 50000 UNITS CAPS capsule Take 1 capsule by mouth once weekly. 02/21/15  Yes Pleas Koch, NP  cephALEXin (KEFLEX) 500 MG capsule Take 1 capsule (500 mg total) by mouth 2 (two) times daily. 04/05/15   Diane Hacker, MD  oxyCODONE-acetaminophen (PERCOCET/ROXICET) 5-325 MG per tablet Take 1 tablet by mouth every 6 (six) hours as needed for severe pain. 04/05/15   Diane Hacker, MD   BP 164/84 mmHg  Pulse 86  Temp(Src) 98.2 F (36.8 C) (Oral)  Resp 22  Ht 5\' 6"  (1.676 m)  Wt 174 lb (78.926 kg)  BMI 28.10  kg/m2  SpO2 98% Physical Exam  Constitutional: She is oriented to person, place, and time. No distress.  Elderly  HENT:  Head: Normocephalic.  Dressing and stitches noted of the left nose, incision clean dry and intact  Eyes: Pupils are equal, round, and reactive to light.  Cardiovascular: Normal rate, regular rhythm and normal heart sounds.   No murmur heard. Pulmonary/Chest: Effort normal and breath sounds normal. No respiratory distress. She has no wheezes.  Abdominal: Soft. Bowel sounds are normal. There is no tenderness. There is  no rebound and no guarding.  Neurological: She is alert and oriented to person, place, and time.  Skin: Skin is warm and dry.  Psychiatric: She has a normal mood and affect.  Nursing note and vitals reviewed.   ED Course  Procedures  DIAGNOSTIC STUDIES: Oxygen Saturation is 98% on RA, normal by my interpretation.    COORDINATION OF CARE: 3:13 AM Discussed treatment plan with pt and daughter. Pt and daughter acknowledge and agree to plan.   Labs Review Labs Reviewed  URINALYSIS, ROUTINE W REFLEX MICROSCOPIC (NOT AT [facility]) - Abnormal; Notable for the following:    Color, Urine AMBER (*)    APPearance TURBID (*)    Hgb urine dipstick LARGE (*)    Protein, ur 100 (*)    Leukocytes, UA MODERATE (*)    All other components within normal limits  URINE MICROSCOPIC-ADD ON - Abnormal; Notable for the following:    Bacteria, UA FEW (*)    Casts HYALINE CASTS (*)    All other components within normal limits  CBC WITH DIFFERENTIAL/PLATELET - Abnormal; Notable for the following:    RBC 3.85 (*)    Hemoglobin 11.5 (*)    HCT 34.8 (*)    All other components within normal limits  COMPREHENSIVE METABOLIC PANEL - Abnormal; Notable for the following:    Glucose, Bld 173 (*)    BUN 23 (*)    Creatinine, Ser 1.34 (*)    Total Protein 6.3 (*)    ALT 13 (*)    GFR calc non Af Amer 37 (*)    GFR calc Af Amer 43 (*)    All other components within normal limits  URINE CULTURE    Imaging Review Ct Renal Stone Study  04/05/2015   CLINICAL DATA:  Left-sided flank and abdominal pain, onset today. History of kidney stones.  EXAM: CT ABDOMEN AND PELVIS WITHOUT CONTRAST  TECHNIQUE: Multidetector CT imaging of the abdomen and pelvis was performed following the standard protocol without IV contrast.  COMPARISON:  None.  FINDINGS: The included lung bases are clear.  Mild left hydroureteronephrosis and perinephric stranding, however no stone within the kidney, course of the ureter or in the bladder.  Probable extrarenal pelvis configuration of the right kidney, no right urolithiasis. The bladder is physiologically distended without stone.  Evaluation of the remaining solid and hollow viscera is limited given lack of contrast. The unenhanced liver, gallbladder, spleen, and pancreas are normal. There is a 2.5 cm right adrenal adenoma with Hounsfield units of 0. Left adrenal gland is normal.  Stomach is physiologically distended. There are no dilated or thickened bowel loops. Small to moderate volume of colonic stool without colonic wall thickening. Distal diverticulosis of the distal descending and sigmoid colon without diverticulitis. No colonic wall thickening. The appendix is not confidently identified, no pericecal or right lower quadrant inflammatory change. No free air, free fluid, or intra-abdominal fluid collection.  No retroperitoneal adenopathy. Abdominal aorta is normal  in caliber. Mild atherosclerosis of the abdominal aorta and its branches. Small fat containing umbilical and supraumbilical hernias.  Within the pelvis the uterus remains in situ. Left ovary measures 2.2 cm, right ovary not definitively seen. No pelvic free fluid. No adenopathy.  There are no acute or suspicious osseous abnormalities. Degenerative change in the lumbar spine with degenerative disc disease and facet arthropathy. There is degenerative change involving the pubic symphysis.  IMPRESSION: 1. Mild left hydroureteronephrosis and perinephric stranding, without identified stone. Findings are suspicious for recently passed stone versus urinary tract infection. 2. Diverticulosis of the distal colon without diverticulitis.   Electronically Signed   By: Jeb Levering M.D.   On: 04/05/2015 04:51   Dg Hip Unilat With Pelvis 2-3 Views Left  04/03/2015   CLINICAL DATA:  Left hip pain, low back pain  EXAM: DG HIP (WITH OR WITHOUT PELVIS) 2-3V LEFT  COMPARISON:  None.  FINDINGS: Three views of the left hip submitted. No acute fracture  or subluxation. Mild degenerative changes with spurring of greater femoral trochanter. Degenerative changes pubic symphysis. Degenerative changes bilateral SI joints.  IMPRESSION: No acute fracture or subluxation. Degenerative changes as described above   Electronically Signed   By: Lahoma Crocker M.D.   On: 04/03/2015 19:53   I have personally reviewed and evaluated these images and lab results as part of my medical decision-making.   EKG Interpretation None      MDM   Final diagnoses:  Flank pain  UTI (lower urinary tract infection)  Kidney stone    Patient presents with flank pain and left back pain. Currently asymptomatic. Recently seen and evaluated for nausea and vomiting. Thought to be secondary to NSAID use. Patient does have a history of kidney stones. Denies any dysuria or hematuria. Physical exam is benign. No signs of peritonitis. Vital signs are reassuring. Lab work and CT scan obtained.  Creatinine improved to 1.34. Urinalysis appears to be infected. Urine culture sent patient given IV Rocephin. CT scan with evidence of possible recently passed stone. Given patient's resolution of symptoms, feel this fits her clinical picture.  No leukocytosis or fever suggestive of pyelonephritis. Patient will be discharged with Keflex for UTI and pain medication for ureteral spasm. Patient was given strict return precautions. Urology follow-up.  After history, exam, and medical workup I feel the patient has been appropriately medically screened and is safe for discharge home. Pertinent diagnoses were discussed with the patient. Patient was given return precautions.  I personally performed the services described in this documentation, which was scribed in my presence. The recorded information has been reviewed and is accurate.    Diane Hacker, MD 04/05/15 [phone]

## 2015-04-05 NOTE — ED Notes (Signed)
Dr. Horton at bedside at this time.  

## 2015-04-06 LAB — URINE CULTURE

## 2015-04-10 ENCOUNTER — Ambulatory Visit: Payer: Medicare Other | Admitting: Primary Care

## 2015-04-14 ENCOUNTER — Telehealth: Payer: Self-pay | Admitting: Primary Care

## 2015-04-14 NOTE — Telephone Encounter (Signed)
Noted.  That sounds appropriate.

## 2015-04-14 NOTE — Telephone Encounter (Signed)
Patient's daughter called to cancel patient's ER f/u appointment with Anda Kraft for next week.  Patient's daughter said patient saw Urologist and her kidneys are clear.  Patient's daughter said patient has several other appointments and it's too hard for patient to come to all of the appointments at her age.  Patient's daughter said patient will keep follow up appointment with Anda Kraft in October.

## 2015-04-20 ENCOUNTER — Ambulatory Visit: Payer: Medicare Other | Admitting: Primary Care

## 2015-04-27 ENCOUNTER — Telehealth: Payer: Self-pay

## 2015-04-27 NOTE — Telephone Encounter (Signed)
Spoke with brent @ [employer], I told him i spoke with Anda Kraft and she stated that the other physicains needed to fill out fmla paperwork He would have to call pt to get other physicians  i spoke with pt daughter Darolyn Rua 04/26/15 letting her know that Anda Kraft said Ms Diane Ali other physicians needed to fill out her fmla

## 2015-04-27 NOTE — Telephone Encounter (Signed)
[employer] left v/m requesting cb; has previously faxed info requested for FMLA but request cb to see if can be done over phone to expedite the FMLA; will need condition start and end date; anticipated # of episodes,duration of episodes, what is medical need for pts daughter to assist in pts care. [employer] request cb.

## 2015-05-08 ENCOUNTER — Other Ambulatory Visit: Payer: Self-pay

## 2015-05-08 DIAGNOSIS — R1011 Right upper quadrant pain: Secondary | ICD-10-CM

## 2015-05-12 ENCOUNTER — Ambulatory Visit
Admission: RE | Admit: 2015-05-12 | Discharge: 2015-05-12 | Disposition: A | Discharge status: Home / Self Care | Payer: Medicare Other | Source: Ambulatory Visit | Attending: Surgery | Admitting: Surgery

## 2015-05-29 ENCOUNTER — Encounter: Payer: Self-pay | Admitting: Primary Care

## 2015-05-29 ENCOUNTER — Ambulatory Visit (INDEPENDENT_AMBULATORY_CARE_PROVIDER_SITE_OTHER): Payer: Medicare Other | Admitting: Primary Care

## 2015-05-29 VITALS — BP 122/78 | HR 78 | Temp 97.8°F | Ht 66.0 in | Wt 169.0 lb

## 2015-05-29 DIAGNOSIS — E119 Type 2 diabetes mellitus without complications: Secondary | ICD-10-CM | POA: Diagnosis not present

## 2015-05-29 DIAGNOSIS — E785 Hyperlipidemia, unspecified: Secondary | ICD-10-CM

## 2015-05-29 DIAGNOSIS — E559 Vitamin D deficiency, unspecified: Secondary | ICD-10-CM

## 2015-05-29 DIAGNOSIS — E039 Hypothyroidism, unspecified: Secondary | ICD-10-CM

## 2015-05-29 LAB — HEMOGLOBIN A1C: Hgb A1c MFr Bld: 6.7 % — ABNORMAL HIGH (ref 4.6–6.5)

## 2015-05-29 LAB — LIPID PANEL
Cholesterol: 145 mg/dL (ref 0–200)
HDL: 45.6 mg/dL (ref >39.00)
LDL CALC: 77 mg/dL (ref 0–99)
NONHDL: 99.73
Total CHOL/HDL Ratio: 3
Triglycerides: 112 mg/dL (ref 0.0–149.0)
VLDL: 22.4 mg/dL (ref 0.0–40.0)

## 2015-05-29 LAB — TSH: TSH: 2.11 u[IU]/mL (ref 0.35–4.50)

## 2015-05-29 LAB — VITAMIN D 25 HYDROXY (VIT D DEFICIENCY, FRACTURES): VITD: 40.88 ng/mL (ref 30.00–100.00)

## 2015-05-29 MED ORDER — VITAMIN D (ERGOCALCIFEROL) 1.25 MG (50000 UNIT) PO CAPS: ORAL_CAPSULE | ORAL | Status: DC

## 2015-05-29 MED ORDER — ATORVASTATIN CALCIUM 10 MG PO TABS: 10.0000 mg | ORAL_TABLET | Freq: Every day | ORAL | Status: DC

## 2015-05-29 MED ORDER — LEVOTHYROXINE SODIUM 25 MCG PO TABS: 25.0000 ug | ORAL_TABLET | Freq: Every day | ORAL | Status: DC

## 2015-05-29 NOTE — Assessment & Plan Note (Signed)
A1C improved to 6.7 from 6.9. Treatment with medication is not necessary given her age and history of skipping meals. Will continue to monitor. Follow up in 6 months. Microalbumin and foot exam next visit.

## 2015-05-29 NOTE — Assessment & Plan Note (Signed)
Improved on lipitor 10 mg. Discussed healthy diet and daily walking for exercise. Denies myalgias. LFT's stable. Will continue to monitor.

## 2015-05-29 NOTE — Patient Instructions (Signed)
Complete lab work prior to leaving today. I will notify you of your results.  Work to [employer]. Increase consumption of fresh fruits and vegetables, lean meats, and water. Reduce intake of sweets and packaged meats.  Please schedule a follow up appointment in 6 months.   It was a pleasure to see you today!

## 2015-05-29 NOTE — Assessment & Plan Note (Signed)
Much improved on RX strength vitamin D. Will have her on maintenence dose of 2000 units daily. Bone density scan normal in July 2016.

## 2015-05-29 NOTE — Progress Notes (Signed)
Subjective:    Patient ID: Diane Ali, female    DOB: 06-18-1934, 81 y.o.   MRN: [id number]  HPI  Diane Ali is a 81 year old female who presents today for follow up.  1) Type 2 Diabetes: A1C of 6.9 in July 2016. She has been working to reduce her sweets overall. She was not initiated on medication during her last visit due to age and fear of hypoglycemia. She endorses a fair diet since her last visit.  Her diet currently consists of: Breakfast: Skips Lunch: Sandwich, TV dinner (smart ones) Dinner: Sandwich, TV dinners, hot dogs, bologna sandwiches.  Snacks: Candy, cookies, ice cream 2-3 times weekly Beverages: Diet sprite, water  2) Hypothyroidism: TSH of 4.5 with symptoms of fatigue and memory loss. Overall improvement in fatigue and memory since initiation of levothyroxine 25 mcg. Denies palpitations, cold/heat intolerance, hair loss.   3) Vitamin D deficiency: Diagnosed in July 2016 with level of 8.1. Denies any falls. Bone density scan with normal result in July 2016. She's been managed on 50,000 unit vitamin D for the past 12 weeks.  4) Hyperlipidemia: She was initiated on atorvastatin 10 mg last visit due to LDL of 141 in combination with new diagnosis of diabetes. She has been working to improve her diet since last visit. Denies myalgias.  Review of Systems  Constitutional: Negative for fatigue and unexpected weight change.  Respiratory: Negative for shortness of breath.   Cardiovascular: Negative for chest pain.  Endocrine: Negative for cold intolerance and heat intolerance.  Musculoskeletal: Negative for myalgias.  Neurological: Negative for dizziness and numbness.       Past Medical History  Diagnosis Date  . Cataracts, bilateral     Social History   Social History  . Marital Status: Widowed    Spouse Name: N/A  . Number of Children: N/A  . Years of Education: N/A   Occupational History  . Not on file.   Social History Main Topics  . Smoking status:  Never Smoker   . Smokeless tobacco: Not on file  . Alcohol Use: No  . Drug Use: Not on file  . Sexual Activity: Not on file   Other Topics Concern  . Not on file   Social History Narrative   Widow   Retired. Once worked in [employer].   Enjoys reading, watching TV, being with her family.   Lives alone.    Past Surgical History  Procedure Laterality Date  . Cataract extraction  2016    Family History  Problem Relation Age of Onset  . Diabetes Father   . Alzheimer's disease Brother     No Known Allergies  Current Outpatient Prescriptions on File Prior to Visit  Medication Sig Dispense Refill  . acetaminophen (TYLENOL) 500 MG tablet Take 1,000 mg by mouth every 6 (six) hours as needed for moderate pain.      No current facility-administered medications on file prior to visit.    BP 122/78 mmHg  Pulse 78  Temp(Src) 97.8 F (36.6 C) (Oral)  Ht 5\' 6"  (1.676 m)  Wt 169 lb (76.658 kg)  BMI 27.29 kg/m2  SpO2 98%    Objective:   Physical Exam  Constitutional: She is oriented to person, place, and time. She appears well-nourished.  Neck: Neck supple. No thyromegaly present.  Cardiovascular: Normal rate and regular rhythm.   Pulmonary/Chest: Effort normal and breath sounds normal.  Neurological: She is alert and oriented to person, place, and time.  Skin: Skin  is warm and dry.  Psychiatric: She has a normal mood and affect.          Assessment & Plan:

## 2015-05-29 NOTE — Assessment & Plan Note (Signed)
Initiated on levothyroxine 25 mcg last visit. TSH improved on dose Improvement with memory and fatigue, overall feels better. Will continue to monitor, continue current dose.

## 2015-06-16 ENCOUNTER — Other Ambulatory Visit: Payer: Self-pay | Admitting: Primary Care

## 2015-06-16 NOTE — Telephone Encounter (Signed)
Electronically refill request for   atorvastatin (LIPITOR) 10 MG tablet   Take 1 tablet (10 mg total) by mouth daily.  Dispense: 30 tablet   Refills: 5    Note from pharmacy request 90 days supply. Last prescribed on 02/21/2015. Next appt on 08/29/2015.

## 2015-06-19 NOTE — Telephone Encounter (Signed)
Electronically refill request for   levothyroxine (LEVOTHROID) 25 MCG tablet   Take 1 tablet (25 mcg total) by mouth daily before breakfast.  Dispense: 30 tablet   Refills: 3     Last prescribed on 02/21/2015. Lase seen on 05/29/2015. Next follow up on 08/13/2015.

## 2015-08-28 ENCOUNTER — Telehealth: Payer: Self-pay | Admitting: Primary Care

## 2015-08-28 NOTE — Telephone Encounter (Signed)
Tried to call but phone kept ringing.

## 2015-08-28 NOTE — Telephone Encounter (Signed)
It looks like from my documentation that I don't need to see Diane Ali until April 2017 for follow up. She is scheduled Tuesday (tomorrow) for a 3 month follow up?  Will you please have her schedule for April, unless she'd like to see me tomorrow.  Thanks.

## 2015-08-29 ENCOUNTER — Ambulatory Visit: Payer: Medicare Other | Admitting: Primary Care

## 2015-12-14 ENCOUNTER — Other Ambulatory Visit: Payer: Self-pay | Admitting: Primary Care

## 2016-02-16 ENCOUNTER — Other Ambulatory Visit: Payer: Self-pay | Admitting: Primary Care

## 2016-02-16 DIAGNOSIS — E039 Hypothyroidism, unspecified: Secondary | ICD-10-CM

## 2016-02-16 DIAGNOSIS — E785 Hyperlipidemia, unspecified: Secondary | ICD-10-CM

## 2016-02-16 DIAGNOSIS — E119 Type 2 diabetes mellitus without complications: Secondary | ICD-10-CM

## 2016-02-16 DIAGNOSIS — E559 Vitamin D deficiency, unspecified: Secondary | ICD-10-CM

## 2016-02-20 ENCOUNTER — Other Ambulatory Visit (INDEPENDENT_AMBULATORY_CARE_PROVIDER_SITE_OTHER): Payer: Medicare Other

## 2016-02-20 DIAGNOSIS — E039 Hypothyroidism, unspecified: Secondary | ICD-10-CM

## 2016-02-20 DIAGNOSIS — E559 Vitamin D deficiency, unspecified: Secondary | ICD-10-CM

## 2016-02-20 DIAGNOSIS — E119 Type 2 diabetes mellitus without complications: Secondary | ICD-10-CM | POA: Diagnosis not present

## 2016-02-20 DIAGNOSIS — E785 Hyperlipidemia, unspecified: Secondary | ICD-10-CM

## 2016-02-20 LAB — LIPID PANEL
CHOLESTEROL: 119 mg/dL (ref 0–200)
HDL: 39.8 mg/dL (ref >39.00)
LDL Cholesterol: 58 mg/dL (ref 0–99)
NonHDL: 79.45
Total CHOL/HDL Ratio: 3
Triglycerides: 106 mg/dL (ref 0.0–149.0)
VLDL: 21.2 mg/dL (ref 0.0–40.0)

## 2016-02-20 LAB — COMPREHENSIVE METABOLIC PANEL
ALT: 19 U/L (ref 0–35)
AST: 28 U/L (ref 0–37)
Albumin: 4.3 g/dL (ref 3.5–5.2)
Alkaline Phosphatase: 49 U/L (ref 39–117)
BILIRUBIN TOTAL: 0.7 mg/dL (ref 0.2–1.2)
BUN: 19 mg/dL (ref 6–23)
CO2: 25 meq/L (ref 19–32)
CREATININE: 1.09 mg/dL (ref 0.40–1.20)
Calcium: 10.2 mg/dL (ref 8.4–10.5)
Chloride: 102 mEq/L (ref 96–112)
GFR: 51.38 mL/min — AB (ref >60.00)
GLUCOSE: 148 mg/dL — AB (ref 70–99)
Potassium: 4.2 mEq/L (ref 3.5–5.1)
Sodium: 135 mEq/L (ref 135–145)
Total Protein: 7.3 g/dL (ref 6.0–8.3)

## 2016-02-20 LAB — HEMOGLOBIN A1C: HEMOGLOBIN A1C: 6.9 % — AB (ref 4.6–6.5)

## 2016-02-20 LAB — TSH: TSH: 3.16 u[IU]/mL (ref 0.35–4.50)

## 2016-02-20 LAB — VITAMIN D 25 HYDROXY (VIT D DEFICIENCY, FRACTURES): VITD: 55.37 ng/mL (ref 30.00–100.00)

## 2016-02-22 ENCOUNTER — Telehealth: Payer: Self-pay | Admitting: Primary Care

## 2016-02-22 NOTE — Telephone Encounter (Signed)
LM for pt to sch appt on 7/17 at 11:15 for 30 min AWV with Katha Cabal, [place]

## 2016-02-26 ENCOUNTER — Ambulatory Visit (INDEPENDENT_AMBULATORY_CARE_PROVIDER_SITE_OTHER): Payer: Medicare Other

## 2016-02-26 ENCOUNTER — Ambulatory Visit (INDEPENDENT_AMBULATORY_CARE_PROVIDER_SITE_OTHER): Payer: Medicare Other | Admitting: Primary Care

## 2016-02-26 ENCOUNTER — Encounter: Payer: Self-pay | Admitting: Primary Care

## 2016-02-26 VITALS — BP 110/70 | HR 72 | Temp 97.6°F | Ht 64.0 in | Wt 172.0 lb

## 2016-02-26 DIAGNOSIS — E039 Hypothyroidism, unspecified: Secondary | ICD-10-CM | POA: Diagnosis not present

## 2016-02-26 DIAGNOSIS — Z23 Encounter for immunization: Secondary | ICD-10-CM | POA: Diagnosis not present

## 2016-02-26 DIAGNOSIS — Z0000 Encounter for general adult medical examination without abnormal findings: Secondary | ICD-10-CM | POA: Diagnosis not present

## 2016-02-26 DIAGNOSIS — E785 Hyperlipidemia, unspecified: Secondary | ICD-10-CM | POA: Diagnosis not present

## 2016-02-26 DIAGNOSIS — E559 Vitamin D deficiency, unspecified: Secondary | ICD-10-CM

## 2016-02-26 DIAGNOSIS — E119 Type 2 diabetes mellitus without complications: Secondary | ICD-10-CM

## 2016-02-26 MED ORDER — ZOSTER VACCINE LIVE 19400 UNT/0.65ML ~~LOC~~ SUSR: 0.6500 mL | Freq: Once | SUBCUTANEOUS | Status: DC

## 2016-02-26 NOTE — Patient Instructions (Signed)
Work to reduce consumption of sweets (candy, cookies, cakes, pies, etc) as this will cause an increase in your blood sugars which will worsen your diabetes.  Take the Shingles vaccination to the pharmacy in 1 month for administration.   Continue all of your current medications including Vitamin D, atorvatatin, and levothyroxine.  Schedule a lab only appointment in 6 months to recheck your A1C levels.  Follow up in 1 year or sooner if needed.  It was a pleasure to see you today!  Diabetes Mellitus and Food It is important for you to manage your blood sugar (glucose) level. Your blood glucose level can be greatly affected by what you eat. Eating healthier foods in the appropriate amounts throughout the day at about the same time each day will help you control your blood glucose level. It can also help slow or prevent worsening of your diabetes mellitus. Healthy eating may even help you improve the level of your blood pressure and reach or maintain a healthy weight.  General recommendations for healthful eating and cooking habits include:  Eating meals and snacks regularly. Avoid going long periods of time without eating to lose weight.  Eating a diet that consists mainly of plant-based foods, such as fruits, vegetables, nuts, legumes, and whole grains.  Using low-heat cooking methods, such as baking, instead of high-heat cooking methods, such as deep frying. Work with your dietitian to make sure you understand how to use the Nutrition Facts information on food labels. HOW CAN FOOD AFFECT ME? Carbohydrates Carbohydrates affect your blood glucose level more than any other type of food. Your dietitian will help you determine how many carbohydrates to eat at each meal and teach you how to count carbohydrates. Counting carbohydrates is important to keep your blood glucose at a healthy level, especially if you are using insulin or taking certain medicines for diabetes mellitus. Alcohol Alcohol can  cause sudden decreases in blood glucose (hypoglycemia), especially if you use insulin or take certain medicines for diabetes mellitus. Hypoglycemia can be a life-threatening condition. Symptoms of hypoglycemia (sleepiness, dizziness, and disorientation) are similar to symptoms of having too much alcohol.  If your health care provider has given you approval to drink alcohol, do so in moderation and use the following guidelines:  Women should not have more than one drink per day, and men should not have more than two drinks per day. One drink is equal to:  12 oz of beer.  5 oz of wine.  1 oz of hard liquor.  Do not drink on an empty stomach.  Keep yourself hydrated. Have water, diet soda, or unsweetened iced tea.  Regular soda, juice, and other mixers might contain a lot of carbohydrates and should be counted. WHAT FOODS ARE NOT RECOMMENDED? As you make food choices, it is important to remember that all foods are not the same. Some foods have fewer nutrients per serving than other foods, even though they might have the same number of calories or carbohydrates. It is difficult to get your body what it needs when you eat foods with fewer nutrients. Examples of foods that you should avoid that are high in calories and carbohydrates but low in nutrients include:  Trans fats (most processed foods list trans fats on the Nutrition Facts label).  Regular soda.  Juice.  Candy.  Sweets, such as cake, pie, doughnuts, and cookies.  Fried foods. WHAT FOODS CAN I EAT? Eat nutrient-rich foods, which will nourish your body and keep you healthy. The food you  should eat also will depend on several factors, including:  The calories you need.  The medicines you take.  Your weight.  Your blood glucose level.  Your blood pressure level.  Your cholesterol level. You should eat a variety of foods, including:  Protein.  Lean cuts of meat.  Proteins low in saturated fats, such as fish, egg  whites, and beans. Avoid processed meats.  Fruits and vegetables.  Fruits and vegetables that may help control blood glucose levels, such as apples, mangoes, and yams.  Dairy products.  Choose fat-free or low-fat dairy products, such as milk, yogurt, and cheese.  Grains, bread, pasta, and rice.  Choose whole grain products, such as multigrain bread, whole oats, and brown rice. These foods may help control blood pressure.  Fats.  Foods containing healthful fats, such as nuts, avocado, olive oil, canola oil, and fish. DOES EVERYONE WITH DIABETES MELLITUS HAVE THE SAME MEAL PLAN? Because every person with diabetes mellitus is different, there is not one meal plan that works for everyone. It is very important that you meet with a dietitian who will help you create a meal plan that is just right for you.   This information is not intended to replace advice given to you by your health care provider. Make sure you discuss any questions you have with your health care provider.   Document Released: 04/25/2005 Document Revised: 08/19/2014 Document Reviewed: 06/25/2013 Elsevier Interactive Patient Education [employer].

## 2016-02-26 NOTE — Assessment & Plan Note (Signed)
A1c is 6.9 on recent labs. This is considered well controlled given her age. Discussed importance of reduction in sweets and increase in daily activity/exercise. Recheck A1c in 6 months.

## 2016-02-26 NOTE — Progress Notes (Signed)
PCP notes:  Health maintenance  PPSV23 - administered Shingles - postponed/insurance  Abnormal screenings:  Hearing - failed Fall risk - hx of fall without injury Cognitive - Mini-Cog score: 16/20  Patient concerns: None  Nurse concerns: None  Next PCP appt: 02/26/16 @ 1145

## 2016-02-26 NOTE — Progress Notes (Signed)
Subjective:   Diane Ali is a 82 y.o. female who presents for Medicare Annual (Subsequent) preventive examination.  Review of Systems:  N/A Cardiac Risk Factors include: advanced age (>40men, >31 women);dyslipidemia     Objective:     Vitals: BP 110/70 mmHg  Pulse 72  Temp(Src) 97.6 F (36.4 C) (Oral)  Ht 5\' 4"  (1.626 m)  Wt 172 lb (78.019 kg)  BMI 29.51 kg/m2  SpO2 97%  Body mass index is 29.51 kg/(m^2).   Tobacco History  Smoking status  . Never Smoker   Smokeless tobacco  . Not on file     Counseling given: No   Past Medical History  Diagnosis Date  . Cataracts, bilateral    Past Surgical History  Procedure Laterality Date  . Cataract extraction  2016   Family History  Problem Relation Age of Onset  . Diabetes Father   . Alzheimer's disease Brother    History  Sexual Activity  . Sexual Activity: No    Outpatient Encounter Prescriptions as of 02/26/2016  Medication Sig  . acetaminophen (TYLENOL) 500 MG tablet Take 1,000 mg by mouth every 6 (six) hours as needed for moderate pain.   Marland Kitchen atorvastatin (LIPITOR) 10 MG tablet TAKE 1 TABLET(10 MG) BY MOUTH DAILY  . Cholecalciferol (VITAMIN D3) 2000 units TABS Take 2,000 Units by mouth daily.  Marland Kitchen levothyroxine (SYNTHROID, LEVOTHROID) 25 MCG tablet TAKE 1 TABLET(25 MCG) BY MOUTH DAILY BEFORE BREAKFAST  . [DISCONTINUED] atorvastatin (LIPITOR) 10 MG tablet Take 1 tablet (10 mg total) by mouth daily.  . [DISCONTINUED] Vitamin D, Ergocalciferol, (DRISDOL) 50000 UNITS CAPS capsule Take 1 capsule by mouth once weekly.   No facility-administered encounter medications on file as of 02/26/2016.    Activities of Daily Living In your present state of health, do you have any difficulty performing the following activities: 02/26/2016  Hearing? N  Vision? N  Difficulty concentrating or making decisions? N  Walking or climbing stairs? N  Dressing or bathing? N  Doing errands, shopping? N  Preparing Food and eating ?  N  Using the Toilet? N  In the past six months, have you accidently leaked urine? N  Do you have problems with loss of bowel control? N  Managing your Medications? N  Managing your Finances? N  Housekeeping or managing your Housekeeping? N    Patient Care Team: Pleas Koch, NP as PCP - General (Nurse Practitioner)    Assessment:     Hearing Screening   125Hz  250Hz  500Hz  1000Hz  2000Hz  4000Hz  8000Hz   Right ear:   40 40 40 40   Left ear:   40 40 40 0   Vision Screening Comments: Last vision exam in Sept 2016  Exercise Activities and Dietary recommendations Current Exercise Habits: Home exercise routine, Type of exercise: walking, Time (Minutes): 30, Frequency (Times/Week): 4, Weekly Exercise (Minutes/Week): 120, Intensity: Mild, Exercise limited by: None identified  Goals    . Increase physical activity     Starting 02/26/16, I will continue to walk for at least 30 min 4 days per week.       Fall Risk Fall Risk  02/26/2016 02/26/2016 02/21/2015  Falls in the past year? Yes Yes No  Number falls in past yr: 1 1 -  Injury with Fall? No No -  Follow up Falls evaluation completed Falls evaluation completed -   Depression Screen PHQ 2/9 Scores 02/26/2016 02/26/2016 02/21/2015  PHQ - 2 Score 0 0 0     Cognitive  Testing MMSE - Mini Mental State Exam 02/26/2016  Orientation to time 5  Orientation to Place 5  Registration 3  Attention/ Calculation 0  Recall 0  Recall-comments pt was unable to recall 3 of 3 words  Language- name 2 objects 0  Language- repeat 1  Language- follow 3 step command 2  Language- follow 3 step command-comments pt was unable to follow 1 step of 3 step command  Language- read & follow direction 0  Write a sentence 0  Copy design 0  Total score 16   PLEASE NOTE: A Mini-Cog screen was completed. Maximum score is 20. A value of 0 denotes this part of Folstein MMSE was not completed or the patient failed this part of the Mini-Cog screening.   Mini-Cog  Screening Orientation to Time - Max 5 pts Orientation to Place - Max 5 pts Registration - Max 3 pts Recall - Max 3 pts Language Repeat - Max 1 pts Language Follow 3 Step Command - Max 3 pts  Immunization History  Administered Date(s) Administered  . Pneumococcal Conjugate-13 02/21/2015  . Pneumococcal Polysaccharide-23 02/26/2016  . Tdap 02/21/2015   Screening Tests Health Maintenance  Topic Date Due  . ZOSTAVAX  02/25/2017 (Originally 06/21/1996)  . INFLUENZA VACCINE  03/12/2016  . OPHTHALMOLOGY EXAM  04/26/2016  . HEMOGLOBIN A1C  08/22/2016  . TETANUS/TDAP  02/20/2025  . DEXA SCAN  Completed  . PNA vac Low Risk Adult  Completed      Plan:     I have personally reviewed and addressed the Medicare Annual Wellness questionnaire and have noted the following in the patient's chart:  A. Medical and social history B. Use of alcohol, tobacco or illicit drugs  C. Current medications and supplements D. Functional ability and status E.  Nutritional status F.  Physical activity G. Advance directives H. List of other physicians I.  Hospitalizations, surgeries, and ER visits in previous 12 months J.  [place] to include hearing, vision, cognitive, depression L. Referrals and appointments - none  In addition, I have reviewed and discussed with patient certain preventive protocols, quality metrics, and best practice recommendations. A written personalized care plan for preventive services as well as general preventive health recommendations were provided to patient.  See attached scanned questionnaire for additional information.   Signed,   Lindell Noe, MHA, BS, LPN Health Advisor

## 2016-02-26 NOTE — Progress Notes (Signed)
Pre visit review using our clinic review tool, if applicable. No additional management support is needed unless otherwise documented below in the visit note. 

## 2016-02-26 NOTE — Progress Notes (Signed)
Subjective:    Patient ID: Diane Ali, female    DOB: 1933/10/21, 82 y.o.   MRN: [id number]  HPI  Diane Ali is a 82 year old female who presents today for complete physical.  Immunizations: -Tetanus: Completed in 2016. -Influenza: Did not receive last season. -Pneumonia: Completed and UTD. -Shingles: Has never completed.   Diet: She endorses a fair diet. Breakfast: Skips  Lunch: Sandwich, soup Dinner: Frozen dinners, soup Snacks: Sweets, cookies Desserts: Several times weekly Beverages: Diet sprite, water  Exercise: She does not currently exercise, she is active around her home. Eye exam: Completed September 2016 Dental exam: Has not been evalutaed recently. Colonoscopy: Colonoscopy never completed, declines due to age. Dexa: Completed in 2016, normal. Pap Smear: Has not completed recently. Mammogram: Never completed, declines due to age.   Review of Systems  Constitutional: Negative for unexpected weight change.  HENT: Negative for rhinorrhea.   Respiratory: Negative for cough and shortness of breath.   Cardiovascular: Negative for chest pain.  Gastrointestinal: Negative for diarrhea and constipation.  Genitourinary: Negative for difficulty urinating.  Musculoskeletal: Negative for myalgias and arthralgias.  Skin: Negative for rash.  Allergic/Immunologic: Negative for environmental allergies.  Neurological: Negative for dizziness, numbness and headaches.  Psychiatric/Behavioral:       Denies concerns for anxiety or depression       Past Medical History  Diagnosis Date  . Cataracts, bilateral      Social History   Social History  . Marital Status: Widowed    Spouse Name: N/A  . Number of Children: N/A  . Years of Education: N/A   Occupational History  . Not on file.   Social History Main Topics  . Smoking status: Never Smoker   . Smokeless tobacco: Not on file  . Alcohol Use: No  . Drug Use: Not on file  . Sexual Activity: No   Other  Topics Concern  . Not on file   Social History Narrative   Widow   Retired. Once worked in [employer].   Enjoys reading, watching TV, being with her family.   Lives alone.    Past Surgical History  Procedure Laterality Date  . Cataract extraction  2016    Family History  Problem Relation Age of Onset  . Diabetes Father   . Alzheimer's disease Brother     No Known Allergies  Current Outpatient Prescriptions on File Prior to Visit  Medication Sig Dispense Refill  . acetaminophen (TYLENOL) 500 MG tablet Take 1,000 mg by mouth every 6 (six) hours as needed for moderate pain.     Marland Kitchen atorvastatin (LIPITOR) 10 MG tablet TAKE 1 TABLET(10 MG) BY MOUTH DAILY 90 tablet 2  . levothyroxine (SYNTHROID, LEVOTHROID) 25 MCG tablet TAKE 1 TABLET(25 MCG) BY MOUTH DAILY BEFORE BREAKFAST 90 tablet 0   No current facility-administered medications on file prior to visit.    BP 110/70 mmHg  Pulse 72  Temp(Src) 97.6 F (36.4 C) (Oral)  Ht 5\' 4"  (1.626 m)  Wt 172 lb (78.019 kg)  BMI 29.51 kg/m2  SpO2 97%    Objective:   Physical Exam  Constitutional: She is oriented to person, place, and time. She appears well-nourished.  HENT:  Right Ear: Tympanic membrane and ear canal normal.  Left Ear: Tympanic membrane and ear canal normal.  Nose: Nose normal.  Mouth/Throat: Oropharynx is clear and moist.  Eyes: Conjunctivae and EOM are normal. Pupils are equal, round, and reactive to light.  Neck: Neck supple. No  thyromegaly present.  Cardiovascular: Normal rate and regular rhythm.   No murmur heard. Pulmonary/Chest: Effort normal and breath sounds normal. She has no rales.  Abdominal: Soft. Bowel sounds are normal. There is no tenderness.  Musculoskeletal: Normal range of motion.  Lymphadenopathy:    She has no cervical adenopathy.  Neurological: She is alert and oriented to person, place, and time. She has normal reflexes. No cranial nerve deficit.  Skin: Skin is warm and dry.  Small, mild  circular rash located to periumbillical region. Does not appear infectious.  Psychiatric: She has a normal mood and affect.          Assessment & Plan:

## 2016-02-26 NOTE — Assessment & Plan Note (Signed)
Pneumonia vaccinations and tetanus up-to-date. Prescription for Zostavax provided and notified to obtain at her local pharmacy no sooner than 1 month. Labs stable. Exam unremarkable except for mild erythema to perimbilical region, likely from sweating. Discussed the importance of reduction in sweets as she is diabetic, discussed importance of regular exercise/activity. Declines mammogram, Pap, colonoscopy. Appropriate given age. Repeat A1c in 6 months. Follow-up in one year for repeat physical/wellness visit.

## 2016-02-26 NOTE — Assessment & Plan Note (Signed)
Recent lipid panel stable. Continue atorvastatin 10 mg. 

## 2016-02-26 NOTE — Assessment & Plan Note (Signed)
Completed with health advisor this morning.

## 2016-02-26 NOTE — Assessment & Plan Note (Signed)
Recent TSH stable. Continue levothyroxine 25 g.

## 2016-02-26 NOTE — Patient Instructions (Addendum)
Diane Ali , Thank you for taking time to come for your Medicare Wellness Visit. I appreciate your ongoing commitment to your health goals. Please review the following plan we discussed and let me know if I can assist you in the future.   These are the goals we discussed: Goals    . Increase physical activity     Starting 02/26/16, I will continue to walk for at least 30 min 4 days per week.        This is a list of the screening recommended for you and due dates:  Health Maintenance  Topic Date Due  . Complete foot exam   06/21/1944  . Urine Protein Check  06/21/1944  . Pneumonia vaccines (2 of 2 - PPSV23) 02/21/2016  . Shingles Vaccine  02/25/2017*  . Flu Shot  03/12/2016  . Eye exam for diabetics  04/26/2016  . Hemoglobin A1C  08/22/2016  . Tetanus Vaccine  02/20/2025  . DEXA scan (bone density measurement)  Completed  *Topic was postponed. The date shown is not the original due date.    Preventive Care for Adults  A healthy lifestyle and preventive care can promote health and wellness. Preventive health guidelines for adults include the following key practices.  . A routine yearly physical is a good way to check with your health care provider about your health and preventive screening. It is a chance to share any concerns and updates on your health and to receive a thorough exam.  . Visit your dentist for a routine exam and preventive care every 6 months. Brush your teeth twice a day and floss once a day. Good oral hygiene prevents tooth decay and gum disease.  . The frequency of eye exams is based on your age, health, family medical history, use  of contact lenses, and other factors. Follow your health care provider's ecommendations for frequency of eye exams.  . Eat a healthy diet. Foods like vegetables, fruits, whole grains, low-fat dairy products, and lean protein foods contain the nutrients you need without too many calories. Decrease your intake of foods high in solid  fats, added sugars, and salt. Eat the right amount of calories for you. Get information about a proper diet from your health care provider, if necessary.  . Regular physical exercise is one of the most important things you can do for your health. Most adults should get at least 150 minutes of moderate-intensity exercise (any activity that increases your heart rate and causes you to sweat) each week. In addition, most adults need muscle-strengthening exercises on 2 or more days a week.  Silver Sneakers may be a benefit available to you. To determine eligibility, you may visit the website: www.silversneakers.com or contact program at (249) 619-4151 Mon-Fri between 8AM-8PM.   . Maintain a healthy weight. The body mass index (BMI) is a screening tool to identify possible weight problems. It provides an estimate of body fat based on height and weight. Your health care provider can find your BMI and can help you achieve or maintain a healthy weight.   For adults 20 years and older: ? A BMI below 18.5 is considered underweight. ? A BMI of 18.5 to 24.9 is normal. ? A BMI of 25 to 29.9 is considered overweight. ? A BMI of 30 and above is considered obese.   . Maintain normal blood lipids and cholesterol levels by exercising and minimizing your intake of saturated fat. Eat a balanced diet with plenty of fruit and vegetables. Blood tests  for lipids and cholesterol should begin at age 27 and be repeated every 5 years. If your lipid or cholesterol levels are high, you are over 50, or you are at high risk for heart disease, you may need your cholesterol levels checked more frequently. Ongoing high lipid and cholesterol levels should be treated with medicines if diet and exercise are not working.  . If you smoke, find out from your health care provider how to quit. If you do not use tobacco, please do not start.  . If you choose to drink alcohol, please do not consume more than 2 drinks per day. One drink is  considered to be 12 ounces (355 mL) of beer, 5 ounces (148 mL) of wine, or 1.5 ounces (44 mL) of liquor.  . If you are 28-39 years old, ask your health care provider if you should take aspirin to prevent strokes.  . Use sunscreen. Apply sunscreen liberally and repeatedly throughout the day. You should seek shade when your shadow is shorter than you. Protect yourself by wearing long sleeves, pants, a wide-brimmed hat, and sunglasses year round, whenever you are outdoors.  . Once a month, do a whole body skin exam, using a mirror to look at the skin on your back. Tell your health care provider of new moles, moles that have irregular borders, moles that are larger than a pencil eraser, or moles that have changed in shape or color.

## 2016-02-26 NOTE — Assessment & Plan Note (Signed)
Recent vitamin D level stable. Continue vitamin D 2000 units daily. Bone density testing in 2016 normal.

## 2016-02-27 NOTE — Progress Notes (Signed)
I reviewed health advisor's note, was available for consultation, and agree with documentation and plan.  

## 2016-03-12 ENCOUNTER — Other Ambulatory Visit: Payer: Self-pay | Admitting: Primary Care

## 2016-03-12 DIAGNOSIS — E785 Hyperlipidemia, unspecified: Secondary | ICD-10-CM

## 2016-03-12 DIAGNOSIS — E039 Hypothyroidism, unspecified: Secondary | ICD-10-CM

## 2016-08-19 ENCOUNTER — Other Ambulatory Visit: Payer: Self-pay | Admitting: Primary Care

## 2016-08-19 DIAGNOSIS — E119 Type 2 diabetes mellitus without complications: Secondary | ICD-10-CM

## 2016-08-28 ENCOUNTER — Other Ambulatory Visit: Payer: Medicare Other

## 2016-09-03 ENCOUNTER — Other Ambulatory Visit: Payer: Self-pay

## 2016-09-03 ENCOUNTER — Telehealth: Payer: Self-pay | Admitting: Primary Care

## 2016-09-03 ENCOUNTER — Other Ambulatory Visit (INDEPENDENT_AMBULATORY_CARE_PROVIDER_SITE_OTHER): Payer: Medicare Other

## 2016-09-03 DIAGNOSIS — E119 Type 2 diabetes mellitus without complications: Secondary | ICD-10-CM | POA: Diagnosis not present

## 2016-09-03 LAB — HEMOGLOBIN A1C: HEMOGLOBIN A1C: 6.5 % (ref 4.6–6.5)

## 2016-09-03 MED ORDER — LEVOTHYROXINE SODIUM 25 MCG PO TABS: ORAL_TABLET | ORAL | 1 refills | Status: DC

## 2016-09-03 MED ORDER — ATORVASTATIN CALCIUM 10 MG PO TABS: ORAL_TABLET | ORAL | 1 refills | Status: DC

## 2016-09-03 NOTE — Telephone Encounter (Signed)
Daughter called - she stated that shingles shot is covered as long as we call the insurance company before hand.  Daughter Is asking [place] to call [phone]

## 2016-09-03 NOTE — Telephone Encounter (Signed)
Note left to refill levothyroxine and atorvastatin. refill done per protocol and v/m left for pt to ck with pharmacy.

## 2016-09-04 NOTE — Telephone Encounter (Signed)
Message left for patient's daughters to return my call.

## 2016-09-06 ENCOUNTER — Other Ambulatory Visit: Payer: Self-pay | Admitting: Primary Care

## 2016-09-06 MED ORDER — LEVOTHYROXINE SODIUM 25 MCG PO TABS: ORAL_TABLET | ORAL | 0 refills | Status: DC

## 2016-09-06 MED ORDER — ATORVASTATIN CALCIUM 10 MG PO TABS: ORAL_TABLET | ORAL | 0 refills | Status: DC

## 2016-09-11 NOTE — Telephone Encounter (Signed)
Patient's daughter did call back and left a message in the front office for me to call back.  Tried to call on 09/10/2016 and today 09/11/2016

## 2017-01-17 ENCOUNTER — Other Ambulatory Visit: Payer: Self-pay | Admitting: Primary Care

## 2017-01-29 ENCOUNTER — Ambulatory Visit (INDEPENDENT_AMBULATORY_CARE_PROVIDER_SITE_OTHER): Payer: Medicare Other | Admitting: Primary Care

## 2017-01-29 ENCOUNTER — Encounter: Payer: Self-pay | Admitting: Primary Care

## 2017-01-29 VITALS — BP 132/78 | HR 62 | Temp 97.8°F | Ht 64.0 in | Wt 145.8 lb

## 2017-01-29 DIAGNOSIS — F03918 Unspecified dementia, unspecified severity, with other behavioral disturbance: Secondary | ICD-10-CM | POA: Insufficient documentation

## 2017-01-29 DIAGNOSIS — E039 Hypothyroidism, unspecified: Secondary | ICD-10-CM

## 2017-01-29 DIAGNOSIS — F0391 Unspecified dementia with behavioral disturbance: Secondary | ICD-10-CM | POA: Insufficient documentation

## 2017-01-29 DIAGNOSIS — E119 Type 2 diabetes mellitus without complications: Secondary | ICD-10-CM | POA: Diagnosis not present

## 2017-01-29 DIAGNOSIS — F0390 Unspecified dementia without behavioral disturbance: Secondary | ICD-10-CM

## 2017-01-29 MED ORDER — DONEPEZIL HCL 5 MG PO TABS
5.0000 mg | ORAL_TABLET | Freq: Every day | ORAL | 1 refills | Status: DC
Start: 2017-01-29 — End: 2017-06-21

## 2017-01-29 NOTE — Patient Instructions (Addendum)
Start donepezil 5 mg tablets for memory problems.  Complete lab work prior to leaving today. I will notify you of your results once received.   You will be contacted regarding your referral to [facility].  Please let us know if you have not heard back within one week.   Follow up in 3 months for re-evaluation of memory problems.  It was a pleasure to see you today!

## 2017-01-29 NOTE — Assessment & Plan Note (Signed)
TSH pending today, continue levothyroxine 25 mcg for now.

## 2017-01-29 NOTE — Assessment & Plan Note (Signed)
Check A1C today. Encouraged a healthy diet and regular activity.

## 2017-01-29 NOTE — Assessment & Plan Note (Signed)
MMSE score of 19 today. Suspect mild dementia given reports form her daughter and the scoring of her exam. Discussed options for treatment with both daughter and patient, they agree to Aricept. Discussed course of dementia and explained progression. Also discussed that medications don't stop dementia but that it may slow the progression. They verbalized understanding and would like to proceed with Aricept.  Rx for Aricept 5 mg sent to pharmacy. Follow up in 3 months for re-evaluation.
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
## 2017-01-30 LAB — COMPREHENSIVE METABOLIC PANEL
ALBUMIN: 4.2 g/dL (ref 3.5–5.2)
ALT: 13 U/L (ref 0–35)
AST: 27 U/L (ref 0–37)
Alkaline Phosphatase: 40 U/L (ref 39–117)
BILIRUBIN TOTAL: 0.6 mg/dL (ref 0.2–1.2)
BUN: 17 mg/dL (ref 6–23)
CALCIUM: 10.1 mg/dL (ref 8.4–10.5)
CHLORIDE: 105 meq/L (ref 96–112)
CO2: 26 mEq/L (ref 19–32)
CREATININE: 1.19 mg/dL (ref 0.40–1.20)
GFR: 46.32 mL/min — ABNORMAL LOW (ref >60.00)
Glucose, Bld: 110 mg/dL — ABNORMAL HIGH (ref 70–99)
Potassium: 4.3 mEq/L (ref 3.5–5.1)
Sodium: 140 mEq/L (ref 135–145)
Total Protein: 6.7 g/dL (ref 6.0–8.3)

## 2017-01-30 LAB — HEMOGLOBIN A1C: Hgb A1c MFr Bld: 6.2 % (ref 4.6–6.5)

## 2017-01-30 LAB — TSH: TSH: 3.35 u[IU]/mL (ref 0.35–4.50)

## 2017-05-02 ENCOUNTER — Ambulatory Visit (INDEPENDENT_AMBULATORY_CARE_PROVIDER_SITE_OTHER): Payer: Medicare Other | Admitting: Primary Care

## 2017-05-02 ENCOUNTER — Encounter: Payer: Self-pay | Admitting: Primary Care

## 2017-05-02 VITALS — BP 124/78 | HR 68 | Temp 98.2°F | Ht 64.0 in | Wt 154.8 lb

## 2017-05-02 DIAGNOSIS — F329 Major depressive disorder, single episode, unspecified: Secondary | ICD-10-CM | POA: Diagnosis not present

## 2017-05-02 DIAGNOSIS — F32A Depression, unspecified: Secondary | ICD-10-CM

## 2017-05-02 DIAGNOSIS — R7303 Prediabetes: Secondary | ICD-10-CM | POA: Diagnosis not present

## 2017-05-02 DIAGNOSIS — F0390 Unspecified dementia without behavioral disturbance: Secondary | ICD-10-CM | POA: Diagnosis not present

## 2017-05-02 DIAGNOSIS — R350 Frequency of micturition: Secondary | ICD-10-CM

## 2017-05-02 DIAGNOSIS — E785 Hyperlipidemia, unspecified: Secondary | ICD-10-CM

## 2017-05-02 DIAGNOSIS — F419 Anxiety disorder, unspecified: Secondary | ICD-10-CM | POA: Diagnosis not present

## 2017-05-02 LAB — POC URINALSYSI DIPSTICK (AUTOMATED)
Bilirubin, UA: NEGATIVE
Blood, UA: NEGATIVE
Glucose, UA: NEGATIVE
Ketones, UA: NEGATIVE
Nitrite, UA: NEGATIVE
PROTEIN UA: NEGATIVE
Spec Grav, UA: >=1.03 — AB (ref 1.010–1.025)
UROBILINOGEN UA: 1 U/dL
pH, UA: 6 (ref 5.0–8.0)

## 2017-05-02 MED ORDER — SERTRALINE HCL 25 MG PO TABS: 25.0000 mg | ORAL_TABLET | Freq: Every day | ORAL | 0 refills | Status: DC

## 2017-05-02 NOTE — Assessment & Plan Note (Addendum)
Due for repeat lipids today. Family has noticed that she has complained more of lower extremity pain. They took her off of her atorvastatin for a few weeks and her pain improved. Her daughter today is not sure if she still taking atorvastatin, she will have her sister let us know. Consider switching to Crestor if needed.

## 2017-05-02 NOTE — Assessment & Plan Note (Signed)
Based off of the interview with her family, Aricept seems to be helping some, will continue for now. Seems to have some behavioral disturbance with anxiety, family is worried. Discussed options for treatment and we decided to add in low dose Zoloft.  Patient is to take 1/2 tablet daily for 8 days, then advance to 1 full tablet thereafter. We discussed possible side effects of headache, GI upset, drowsiness, and SI/HI. If thoughts of SI/HI develop, we discussed to present to the emergency immediately. Patient verbalized understanding.   We will contact us in 4 weeks for an update in symptoms after initiation of Zoloft.
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
## 2017-05-02 NOTE — Patient Instructions (Addendum)
Start sertraline (Zoloft) 25 mg tablets for anxiety and depression. Start by taking 1/2 tablet by mouth every morning for 8 days, then increase to 1 full tablet thereafter.  Continue taking donepezil 5 mg tablets for memory.  Call or message me in 4 weeks with an update.   Call or message me and notify me if you've still taking atorvastatin for high cholesterol.   Schedule a follow up visit in 6 months.  It was a pleasure to see you today!

## 2017-05-03 LAB — LIPID PANEL
Cholesterol: 145 mg/dL (ref <200)
HDL: 51 mg/dL (ref >50)
LDL Cholesterol (Calc): 75 mg/dL (calc)
Non-HDL Cholesterol (Calc): 94 mg/dL (calc) (ref <130)
TRIGLYCERIDES: 103 mg/dL (ref <150)
Total CHOL/HDL Ratio: 2.8 (calc) (ref <5.0)

## 2017-05-03 LAB — URINE CULTURE
MICRO NUMBER: 81047539
RESULT: NO GROWTH
SPECIMEN QUALITY: ADEQUATE

## 2017-05-03 LAB — HEMOGLOBIN A1C
Hgb A1c MFr Bld: 6 % of total Hgb — ABNORMAL HIGH (ref <5.7)
Mean Plasma Glucose: 126 (calc)
eAG (mmol/L): 7 (calc)

## 2017-05-09 ENCOUNTER — Telehealth: Payer: Self-pay | Admitting: Primary Care

## 2017-05-09 NOTE — Telephone Encounter (Signed)
Spoken to patient's daughter and notified her that urine culture result.

## 2017-05-09 NOTE — Telephone Encounter (Signed)
Caller Name:Caroline Lewis Relationship to Patient:daughter Best number:[phone] Pharmacy:  Reason for call:  Pt daughter returned your call.

## 2017-05-19 ENCOUNTER — Other Ambulatory Visit: Payer: Self-pay | Admitting: Primary Care

## 2017-05-30 ENCOUNTER — Encounter: Payer: Self-pay | Admitting: Primary Care

## 2017-06-04 ENCOUNTER — Ambulatory Visit (INDEPENDENT_AMBULATORY_CARE_PROVIDER_SITE_OTHER): Payer: Medicare Other | Admitting: Primary Care

## 2017-06-04 ENCOUNTER — Encounter: Payer: Self-pay | Admitting: Primary Care

## 2017-06-04 VITALS — BP 124/82 | HR 76 | Temp 98.3°F | Ht 64.0 in | Wt 153.8 lb

## 2017-06-04 DIAGNOSIS — E785 Hyperlipidemia, unspecified: Secondary | ICD-10-CM | POA: Diagnosis not present

## 2017-06-04 DIAGNOSIS — E119 Type 2 diabetes mellitus without complications: Secondary | ICD-10-CM

## 2017-06-04 DIAGNOSIS — M150 Primary generalized (osteo)arthritis: Secondary | ICD-10-CM | POA: Diagnosis not present

## 2017-06-04 DIAGNOSIS — E039 Hypothyroidism, unspecified: Secondary | ICD-10-CM | POA: Diagnosis not present

## 2017-06-04 DIAGNOSIS — R7303 Prediabetes: Secondary | ICD-10-CM

## 2017-06-04 DIAGNOSIS — F0390 Unspecified dementia without behavioral disturbance: Secondary | ICD-10-CM

## 2017-06-04 DIAGNOSIS — F0391 Unspecified dementia with behavioral disturbance: Secondary | ICD-10-CM | POA: Diagnosis not present

## 2017-06-04 DIAGNOSIS — M1990 Unspecified osteoarthritis, unspecified site: Secondary | ICD-10-CM | POA: Insufficient documentation

## 2017-06-04 DIAGNOSIS — M159 Polyosteoarthritis, unspecified: Secondary | ICD-10-CM

## 2017-06-04 LAB — BASIC METABOLIC PANEL
BUN: 19 mg/dL (ref 6–23)
CHLORIDE: 102 meq/L (ref 96–112)
CO2: 29 meq/L (ref 19–32)
CREATININE: 1.12 mg/dL (ref 0.40–1.20)
Calcium: 10 mg/dL (ref 8.4–10.5)
GFR: 49.63 mL/min — ABNORMAL LOW (ref >60.00)
GLUCOSE: 132 mg/dL — AB (ref 70–99)
Potassium: 4.3 mEq/L (ref 3.5–5.1)
Sodium: 139 mEq/L (ref 135–145)

## 2017-06-04 MED ORDER — ESCITALOPRAM OXALATE 5 MG PO TABS: ORAL_TABLET | ORAL | 0 refills | Status: DC

## 2017-06-04 MED ORDER — PRAVASTATIN SODIUM 20 MG PO TABS: ORAL_TABLET | ORAL | 1 refills | Status: DC

## 2017-06-04 NOTE — Assessment & Plan Note (Signed)
Previous A1C stable and improved. Check BMP today given recent diet of processed sweets.

## 2017-06-04 NOTE — Assessment & Plan Note (Signed)
Stable from labs in Summer 2018.  Discussed importance of medication compliance.

## 2017-06-04 NOTE — Assessment & Plan Note (Signed)
Located to numerous joints, mainly to hips bilaterally. Discussed importance of regular activity as she sits at home most of the day. Referral placed for home health PT for range of motion and strengthening exercises. Continue Tylenol as needed.

## 2017-06-04 NOTE — Assessment & Plan Note (Signed)
Zoloft improved anxiety but caused drowsiness. Will trial low dose Lexapro and have the family update.
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
## 2017-06-04 NOTE — Patient Instructions (Addendum)
You will be contacted regarding your referral to [facility]. I will have them complete a home assessment and also assist with medication management and physical therapy. Please let us know if you have not heard back within one week.   Stop sertraline (Zoloft) for anxiety.  Start ecitalopram (Lexapro) for anxiety. Take 1 tablet by mouth once daily.  Stop atorvastatin 10 mg tablets for cholesterol. Start pravastatin 20 mg tablets for cholesterol. Take 1 tablet by mouth every evening.  Complete lab work prior to leaving today. I will notify you of your results once received.   Schedule a lab only appointment in 6 weeks to recheck cholesterol.   It was a pleasure to see you today!

## 2017-06-04 NOTE — Assessment & Plan Note (Addendum)
Atorvastatin could be contributing to lower extremity pain, will switch to low dose pravastatin. Given history of diabetes she is at risk for CVA without proper lipid control.  Recheck lipids in 6 weeks.

## 2017-06-09 ENCOUNTER — Telehealth: Payer: Self-pay | Admitting: Primary Care

## 2017-06-09 NOTE — Telephone Encounter (Signed)
Copied from [place] #[id number]. Topic: General - Other >> Jun 09, 2017 10:03 AM Ebony Hail wrote: Reason for CRM: [facility] [phone] called to  Inform that they are going out to pt's home to start home health care today. >> Jun 09, 2017 10:06 AM Ebony Hail wrote: [facility] [phone] starts home healthcare today.

## 2017-06-09 NOTE — Telephone Encounter (Signed)
Noted  

## 2017-06-09 NOTE — Telephone Encounter (Signed)
Diane Ali from [facility] called to give orders for home health.  Diane Ali says if orders are approved it is ok to leave on her voicemail  [phone]

## 2017-06-09 NOTE — Telephone Encounter (Addendum)
plz get orders from home health so we can approve them - I take it [facility] cannot take verbal orders?

## 2017-06-10 ENCOUNTER — Telehealth: Payer: Self-pay | Admitting: Family Medicine

## 2017-06-10 NOTE — Telephone Encounter (Signed)
Copied from [place] [phone]. Topic: Inquiry >> Jun 10, 2017 11:28 AM Oliver Pila B wrote: Reason for CRM: Jerold Coombe from [facility] called to get the verbal orders for PT: [phone]; she is also wanting to see the PT 2x a week for 1 week, and 1 time a week for 7 weeks mainly for medication management. Call her on her direct line at [phone]

## 2017-06-10 NOTE — Telephone Encounter (Signed)
Left message on vm for Kim with [facility] requesting detailed orders, per Dr. Darnell Level, that she needs verbal ok for.

## 2017-06-10 NOTE — Telephone Encounter (Signed)
Spoke with [employer] per Dr. Darnell Level. Says ok.

## 2017-06-10 NOTE — Telephone Encounter (Signed)
Ok to give this verbal order. Thanks.

## 2017-06-11 NOTE — Telephone Encounter (Signed)
Can this be a verbal order?     Copied from [place] #[id number]. Topic: Referral - Request >> Jun 11, 2017 10:47 AM Conception Chancy, NT wrote: Reason for CRM: Erasmo Downer with [facility] would like K. Clark to put in a order for physical therapy twice a week for eight weeks. She can be contacted at [phone]. thanks

## 2017-06-12 NOTE — Telephone Encounter (Signed)
This was addressed in telephone encounter on 06/10/2017

## 2017-06-21 ENCOUNTER — Other Ambulatory Visit: Payer: Self-pay | Admitting: Primary Care

## 2017-06-21 DIAGNOSIS — F0390 Unspecified dementia without behavioral disturbance: Secondary | ICD-10-CM

## 2017-06-29 DIAGNOSIS — E43 Unspecified severe protein-calorie malnutrition: Secondary | ICD-10-CM | POA: Diagnosis not present

## 2017-07-25 ENCOUNTER — Telehealth: Payer: Self-pay | Admitting: Primary Care

## 2017-07-25 MED ORDER — LEVOTHYROXINE SODIUM 25 MCG PO TABS: ORAL_TABLET | ORAL | 0 refills | Status: DC

## 2017-07-25 NOTE — Telephone Encounter (Signed)
Copied from [place]. Topic: Quick Communication - Rx Refill/Question >> Jul 25, 2017  3:11 PM Marin Olp L wrote: Has the patient contacted their pharmacy? Yes.   (Agent: If no, request that the patient contact the pharmacy for the refill.) Preferred Pharmacy (with phone number or street name): [facility] [place] on [address]: Please be advised that RX refills may take up to 3 business days. We ask that you follow-up with your pharmacy. refill levothyroxine (SYNTHROID, LEVOTHROID) 25 MCG tablet

## 2017-07-25 NOTE — Telephone Encounter (Signed)
Pt is out of Synthroid medication and [facility] contacted and stated that medication should be arriving in about a week. Refill given to pt to supply pt with enough meds until arrival of mail order.

## 2017-07-28 ENCOUNTER — Other Ambulatory Visit: Payer: Medicare Other

## 2017-07-28 ENCOUNTER — Other Ambulatory Visit: Payer: Self-pay | Admitting: Primary Care

## 2017-07-28 ENCOUNTER — Telehealth: Payer: Self-pay

## 2017-07-28 DIAGNOSIS — F32A Depression, unspecified: Secondary | ICD-10-CM

## 2017-07-28 DIAGNOSIS — F419 Anxiety disorder, unspecified: Principal | ICD-10-CM

## 2017-07-28 DIAGNOSIS — F329 Major depressive disorder, single episode, unspecified: Secondary | ICD-10-CM

## 2017-07-28 NOTE — Telephone Encounter (Signed)
Copied from [place] [phone]. Topic: General - Other >> Jul 28, 2017  4:21 PM Patrice Paradise wrote: Reason for CRM: Mariel Kansky from [facility] is requesting a call back from [name] @ [phone]. The patient has a rash on her left arm for a week and otc is not working. Also, [name] is requesting that the patient got to skill nursing for about a week or two. [name] would like a call back to discuss.

## 2017-07-28 NOTE — Telephone Encounter (Signed)
I'm going to defer this to her PCP

## 2017-07-29 NOTE — Telephone Encounter (Signed)
What's the reason for the transition to skilled nursing? Has there been a change? Need more details about the rash. What OTC have they tried? Is it itchy? What does it look like?

## 2017-07-30 ENCOUNTER — Telehealth: Payer: Self-pay | Admitting: Primary Care

## 2017-07-30 ENCOUNTER — Encounter: Payer: Self-pay | Admitting: Primary Care

## 2017-07-30 NOTE — Telephone Encounter (Signed)
Spoken to [employer]. This is an extension of skill nursing patient for 2 weeks, 1 time a week. Patient had a fall and will be discharge.   Rash is on the left arm and hand. It is itchy, red and raised.

## 2017-07-30 NOTE — Telephone Encounter (Signed)
Approve extension of skilled nursing as recommended. She can try over-the-counter hydrocortisone cream twice daily for 1 week, otherwise, difficult to diagnose without examination.

## 2017-07-30 NOTE — Telephone Encounter (Signed)
Copied from [place] [phone]. Topic: General - Other >> Jul 28, 2017  4:21 PM Patrice Paradise wrote: Reason for CRM: Mariel Kansky from [facility] is requesting a call back from [name] @ [phone]. The patient has a rash on her left arm for a week and otc is not working. Also, [name] is requesting that the patient got to skill nursing for about a week or two. [name] would like a call back to discuss.  >> Jul 30, 2017 11:31 AM Neva Seat wrote: Please call [phone] to speak w/ [place] regarding pt rash and therapy. ASAP This is her second call.

## 2017-07-30 NOTE — Telephone Encounter (Signed)
See phone note sent yesterday.

## 2017-07-30 NOTE — Telephone Encounter (Signed)
Spoken and notified Diane Ali of Kate's comments. Diane Ali verbalized understanding.

## 2017-07-31 ENCOUNTER — Other Ambulatory Visit (INDEPENDENT_AMBULATORY_CARE_PROVIDER_SITE_OTHER): Payer: Medicare Other

## 2017-07-31 ENCOUNTER — Ambulatory Visit (INDEPENDENT_AMBULATORY_CARE_PROVIDER_SITE_OTHER): Payer: Medicare Other

## 2017-07-31 DIAGNOSIS — E785 Hyperlipidemia, unspecified: Secondary | ICD-10-CM

## 2017-07-31 DIAGNOSIS — Z23 Encounter for immunization: Secondary | ICD-10-CM | POA: Diagnosis not present

## 2017-07-31 LAB — LIPID PANEL
CHOL/HDL RATIO: 4
CHOLESTEROL: 176 mg/dL (ref 0–200)
HDL: 46.2 mg/dL (ref >39.00)
LDL CALC: 109 mg/dL — AB (ref 0–99)
NonHDL: 129.7
TRIGLYCERIDES: 103 mg/dL (ref 0.0–149.0)
VLDL: 20.6 mg/dL (ref 0.0–40.0)

## 2017-08-01 ENCOUNTER — Telehealth: Payer: Self-pay

## 2017-08-01 NOTE — Telephone Encounter (Signed)
Approved.  

## 2017-08-01 NOTE — Telephone Encounter (Signed)
Lm on Lisa's vm and provided verbal order

## 2017-08-01 NOTE — Telephone Encounter (Signed)
Copied from [place]. Topic: General - Other >> Aug 01, 2017 10:14 AM Bea Graff, NT wrote: Reason for CRM: Mariel Kansky from [facility] calling to get verbal orders for pt to get set up with social work. CB#: [phone]

## 2017-08-08 ENCOUNTER — Telehealth: Payer: Self-pay | Admitting: *Deleted

## 2017-08-08 NOTE — Telephone Encounter (Signed)
Approved.  

## 2017-08-08 NOTE — Telephone Encounter (Signed)
Copied from [place]. Topic: Inquiry >> Aug 08, 2017  2:51 PM Oliver Pila B wrote: Reason for CRM: [name] called from [facility] to get verbal orders to continue services for 1 a week for 5 wks, contact lisa @ [facility]

## 2017-08-11 NOTE — Telephone Encounter (Signed)
Message left for Diane Ali to return my call.

## 2017-08-11 NOTE — Telephone Encounter (Signed)
[name] notified approved verbal orders and voiced understanding.

## 2017-08-11 NOTE — Telephone Encounter (Signed)
Called [name] unable to leave verbals on voicemail for it is currently full. Will attempt again at a later time.

## 2017-09-10 ENCOUNTER — Other Ambulatory Visit: Payer: Self-pay | Admitting: Primary Care

## 2017-09-10 DIAGNOSIS — F0391 Unspecified dementia with behavioral disturbance: Secondary | ICD-10-CM

## 2017-09-16 ENCOUNTER — Telehealth: Payer: Self-pay | Admitting: Primary Care

## 2017-09-16 NOTE — Telephone Encounter (Signed)
Approved.  

## 2017-09-16 NOTE — Telephone Encounter (Signed)
Copied from [place] [phone]. Topic: Inquiry >> Sep 16, 2017  1:27 PM Scherrie Gerlach wrote: Reason for CRM: Tillie Rung with [facility] requesting verbal orders to extend 1 wk / 1 For skilled nursing.

## 2017-09-17 NOTE — Telephone Encounter (Signed)
Spoken to [place] with [facility] and gave her the approval of order.

## 2017-09-23 ENCOUNTER — Telehealth: Payer: Self-pay | Admitting: Primary Care

## 2017-09-23 NOTE — Telephone Encounter (Signed)
Spoken to [facility] and gave her approval for the verbal orders

## 2017-09-23 NOTE — Telephone Encounter (Signed)
Copied from [place] [phone]. Topic: Inquiry >> Sep 23, 2017  9:42 AM Corie Chiquito, [place] wrote: Reason for CRM: [facility] called because she needs verbal orders for skilled nursing for the patient.For 1 time a week for the next 2 weeks. If someone could give her a call back about this at [phone]

## 2017-09-23 NOTE — Telephone Encounter (Signed)
Approved.  

## 2017-10-31 ENCOUNTER — Encounter: Payer: Self-pay | Admitting: Primary Care

## 2017-10-31 ENCOUNTER — Ambulatory Visit (INDEPENDENT_AMBULATORY_CARE_PROVIDER_SITE_OTHER): Payer: Medicare Other | Admitting: Primary Care

## 2017-10-31 VITALS — BP 106/70 | HR 71 | Temp 97.7°F | Ht 64.0 in | Wt 148.0 lb

## 2017-10-31 DIAGNOSIS — F0391 Unspecified dementia with behavioral disturbance: Secondary | ICD-10-CM

## 2017-10-31 DIAGNOSIS — E039 Hypothyroidism, unspecified: Secondary | ICD-10-CM | POA: Diagnosis not present

## 2017-10-31 DIAGNOSIS — E118 Type 2 diabetes mellitus with unspecified complications: Secondary | ICD-10-CM | POA: Diagnosis not present

## 2017-10-31 DIAGNOSIS — M159 Polyosteoarthritis, unspecified: Secondary | ICD-10-CM

## 2017-10-31 DIAGNOSIS — M150 Primary generalized (osteo)arthritis: Secondary | ICD-10-CM | POA: Diagnosis not present

## 2017-10-31 NOTE — Progress Notes (Signed)
Subjective:    Patient ID: Diane Ali, female    DOB: [birth year], 84 y.o.   MRN: [id number]  HPI  Diane Ali is an 84 year old female who presents today for follow up.  1) Dementia: Currently managed on donepezil 5 mg, Lexapro 5 mg. Her daughter reports that she's getting to where she doesn't recognize where she is at night when she's at home. Her daughter thinks that her anxiety has improved since starting the Lexapro. She lives at home by herself. Her daughter is checking on her several times weekly. Her daughter denies falls, increased anxiety.   2) Hypothyroidism: Currently managed on levothyroxine 25 mcg tablets. Last TSH of 3.35 in June 2018.   3) Hyperlipidemia: Currently managed on pravastatin which was switched from atorvastatin due to arthralgias to lower extremities. LDL of 109 in September 2018. Her daughter reports increased ROM and is walking better since switching to pravastatin.   4) Type 2 Diabetes:  Current medications include: None  Last A1C: 6.0 in September 2018 Last Eye Exam: Due Pneumonia Vaccination: Up to date ACE/ARB: Urine microalbumin due Statin: Pravastatin      Review of Systems  Respiratory: Negative for shortness of breath.   Cardiovascular: Negative for chest pain.  Musculoskeletal:       Chronic left lower extremity pain, improved since switching to pravastatin.  Neurological: Negative for dizziness and headaches.       Past Medical History:  Diagnosis Date  . Cataracts, bilateral   . Hyperlipidemia   . Melanoma ([place])    left nares  . Type 2 diabetes mellitus ([place])      Social History   Socioeconomic History  . Marital status: Widowed    Spouse name: Not on file  . Number of children: Not on file  . Years of education: Not on file  . Highest education level: Not on file  Occupational History  . Not on file  Social Needs  . Financial resource strain: Not on file  . Food insecurity:    Worry: Not on file    Inability:  Not on file  . Transportation needs:    Medical: Not on file    Non-medical: Not on file  Tobacco Use  . Smoking status: Never Smoker  . Smokeless tobacco: Never Used  Substance and Sexual Activity  . Alcohol use: No    Alcohol/week: 0.0 oz  . Drug use: Not on file  . Sexual activity: Never  Lifestyle  . Physical activity:    Days per week: Not on file    Minutes per session: Not on file  . Stress: Not on file  Relationships  . Social connections:    Talks on phone: Not on file    Gets together: Not on file    Attends religious service: Not on file    Active member of club or organization: Not on file    Attends meetings of clubs or organizations: Not on file    Relationship status: Not on file  . Intimate partner violence:    Fear of current or ex partner: Not on file    Emotionally abused: Not on file    Physically abused: Not on file    Forced sexual activity: Not on file  Other Topics Concern  . Not on file  Social History Narrative   Widow   Retired. Once worked in [employer].   Enjoys reading, watching TV, being with her family.   Lives alone.  Past Surgical History:  Procedure Laterality Date  . CATARACT EXTRACTION  2016    Family History  Problem Relation Age of Onset  . Diabetes Father   . Alzheimer's disease Brother     No Known Allergies  Current Outpatient Medications on File Prior to Visit  Medication Sig Dispense Refill  . acetaminophen (TYLENOL) 500 MG tablet Take 1,000 mg by mouth every 6 (six) hours as needed for moderate pain.     . Cholecalciferol (VITAMIN D3) 2000 units TABS Take 2,000 Units by mouth daily.    Marland Kitchen donepezil (ARICEPT) 5 MG tablet TAKE 1 TABLET BY MOUTH AT  BEDTIME 90 tablet 1  . escitalopram (LEXAPRO) 5 MG tablet TAKE 1 TABLET BY MOUTH DAILY FOR ANXIETY. 90 tablet 0  . levothyroxine (SYNTHROID, LEVOTHROID) 25 MCG tablet TAKE 1 TABLET BY MOUTH  DAILY BEFORE BREAKFAST 15 tablet 0  . pravastatin (PRAVACHOL) 20 MG tablet Take 1  tablet by mouth every evening for cholesterol. 90 tablet 1   No current facility-administered medications on file prior to visit.     BP 106/70 (BP Location: Left Arm, Patient Position: Sitting, Cuff Size: Large)   Pulse 71   Temp 97.7 F (36.5 C) (Oral)   Ht 5\' 4"  (1.626 m)   Wt 148 lb (67.1 kg)   SpO2 98%   BMI 25.40 kg/m    Objective:   Physical Exam  Constitutional: She appears well-nourished.  Neck: Neck supple.  Cardiovascular: Normal rate and regular rhythm.  Pulmonary/Chest: Effort normal and breath sounds normal.  Neurological: She is alert.  Answers questions appropriately   Skin: Skin is warm and dry.  Psychiatric: She has a normal mood and affect.          Assessment & Plan:

## 2017-10-31 NOTE — Assessment & Plan Note (Signed)
Improved on Lexapro, continue same. Continue Aricept. Patient is safe at home, she is not wandering. Family will continue to check on her regularly. No changes made to medications.

## 2017-10-31 NOTE — Assessment & Plan Note (Signed)
Repeat A1C pending today. Last A1C stable, continue off meds for now.

## 2017-10-31 NOTE — Assessment & Plan Note (Signed)
Repeat TSH pending. Discussed correct directions for levothyroxine administration.

## 2017-10-31 NOTE — Patient Instructions (Addendum)
Stop by the lab prior to leaving today. I will notify you of your results once received.   Make sure you're taking the levothyroxine medication by itself without food or other medications for at least 30 minutes.   Please schedule a follow up appointment in 6 months.   It was a pleasure to see you today!

## 2017-10-31 NOTE — Assessment & Plan Note (Signed)
Improved since switching from atorvastatin to pravastatin. Repeat lipids at next visit.

## 2017-11-01 LAB — TSH: TSH: 3.23 m[IU]/L (ref 0.40–4.50)

## 2017-11-01 LAB — HEMOGLOBIN A1C
Hgb A1c MFr Bld: 6.3 % of total Hgb — ABNORMAL HIGH (ref <5.7)
Mean Plasma Glucose: 134 (calc)
eAG (mmol/L): 7.4 (calc)

## 2017-12-01 ENCOUNTER — Other Ambulatory Visit: Payer: Self-pay | Admitting: Primary Care

## 2017-12-01 DIAGNOSIS — E785 Hyperlipidemia, unspecified: Secondary | ICD-10-CM

## 2017-12-01 NOTE — Telephone Encounter (Signed)
Copied from [place] [id number]. Topic: Quick Communication - Rx Refill/Question >> Dec 01, 2017  1:59 PM Oliver Pila B wrote: Medication: escitalopram (LEXAPRO) 5 MG tablet [[id number]]  CAN THIS BE BUMPED UP TO 10MG  Has the patient contacted their pharmacy? Yes.   (Agent: If no, request that the patient contact the pharmacy for the refill.) [facility]erred [facility] (with phone number [facility] Agent: Please be advised that RX refills may take up to 3 business days. We ask that you follow-up with your pharmacy.

## 2017-12-02 ENCOUNTER — Telehealth: Payer: Self-pay | Admitting: Primary Care

## 2017-12-02 ENCOUNTER — Encounter (INDEPENDENT_AMBULATORY_CARE_PROVIDER_SITE_OTHER): Payer: Self-pay

## 2017-12-02 DIAGNOSIS — F0391 Unspecified dementia with behavioral disturbance: Secondary | ICD-10-CM

## 2017-12-02 MED ORDER — ESCITALOPRAM OXALATE 5 MG PO TABS: ORAL_TABLET | ORAL | 0 refills | Status: DC

## 2017-12-02 NOTE — Telephone Encounter (Signed)
Left message on Diane Ali- # to call back. She is on DPR.

## 2017-12-02 NOTE — Telephone Encounter (Signed)
Darolyn Rua called [phone], left VM to return the call to the office in reference to the lexapro dosage increase. This is a duplicate encounter, message being taken care of by provider in another encounter.

## 2017-12-02 NOTE — Addendum Note (Signed)
Addended by: Pleas Koch on: 12/02/2017 09:18 PM   Modules accepted: Orders

## 2017-12-02 NOTE — Telephone Encounter (Signed)
Daughter Diane Ali called in wanting to know if Diane Ali's Lexapro could be increased to 10 mg from 5 mg.    She has been more anxious lately.   Her last dose is today.  I let Diane Ali know they needed to call in the refill about a week in advance in order for the prescription not to run out before it could be refilled.    She verbalized understanding.    I have routed a note to Diane Ali making her aware of the request to increase the dose of Lexapro.

## 2017-12-02 NOTE — Telephone Encounter (Signed)
My chart message sent to family and patient. Will refill Lexapro at current dose and have patient come in for evaluation to rule out other causes such as UTI, etc.

## 2017-12-02 NOTE — Telephone Encounter (Signed)
Attempted to contact both Darolyn Rua and patient, calls both went to voice mail automatically. Will send my chart message later this evening, I need additional information before I can increase the Lexapro dose of an 84 year old with dementia.

## 2017-12-03 ENCOUNTER — Telehealth: Payer: Self-pay

## 2017-12-03 NOTE — Telephone Encounter (Signed)
Copied from [place] [id number]. Topic: General - Other >> Dec 03, 2017 12:14 PM Yvette Rack wrote: Reason for CRM: patient daughter Chrys Racer [phone] calling stating that her Mother lives alone and its not good for he to live alone and would like to place her into a nursing home as an example pt can't walk well memory is terrible lonely and not eating decent foods

## 2017-12-03 NOTE — Telephone Encounter (Signed)
Chrys Racer pts daughter (DPR signed) said that they have not gotten a nursing facility yet; scheduled 30' appt on 12/08/17 at 12 noon to discuss pt going into nursing facility.

## 2017-12-03 NOTE — Telephone Encounter (Signed)
Noted. Will discuss

## 2017-12-08 ENCOUNTER — Encounter: Payer: Self-pay | Admitting: Primary Care

## 2017-12-08 ENCOUNTER — Ambulatory Visit (INDEPENDENT_AMBULATORY_CARE_PROVIDER_SITE_OTHER): Payer: Medicare Other | Admitting: Primary Care

## 2017-12-08 VITALS — BP 110/70 | HR 66 | Temp 98.0°F | Ht 64.0 in | Wt 145.5 lb

## 2017-12-08 DIAGNOSIS — F0391 Unspecified dementia with behavioral disturbance: Secondary | ICD-10-CM

## 2017-12-08 DIAGNOSIS — F0390 Unspecified dementia without behavioral disturbance: Secondary | ICD-10-CM | POA: Diagnosis not present

## 2017-12-08 DIAGNOSIS — E039 Hypothyroidism, unspecified: Secondary | ICD-10-CM

## 2017-12-08 MED ORDER — DONEPEZIL HCL 5 MG PO TABS: 5.0000 mg | ORAL_TABLET | Freq: Every day | ORAL | 3 refills | Status: DC

## 2017-12-08 MED ORDER — ESCITALOPRAM OXALATE 10 MG PO TABS: ORAL_TABLET | ORAL | 0 refills | Status: DC

## 2017-12-08 MED ORDER — LEVOTHYROXINE SODIUM 25 MCG PO TABS: ORAL_TABLET | ORAL | 2 refills | Status: DC

## 2017-12-08 NOTE — Progress Notes (Signed)
Subjective:    Patient ID: Diane Ali, female    DOB: May 12, 1934, 84 y.o.   MRN: [id number]  HPI  Mr. Whiteside is an 84 year old female who presents today with her daughters to discuss independent living option and anxiety. They would [facility] all of [facility]  Her family has noticed increased anxiety with symptoms of feeling anxious, constantly walking from room to room during visits, daily worry. Symptoms are worse at night but have progressed for the past 1-2 months. Her family believes she may benefit from an increased dose of Lexapro and would like to try this.   Her family is also looking into independent living for assistance. She is not eating much during the day and cannot cook for herself. Her family will find half eaten cans of food in the refrigerator. Her daughter will bring her frozen dinners and prepared sandwiches that will go untouched. Her family has also found her prescription pills around the house and sometimes pills in the pill box from that day. She does keep up with mild house hold chores such as washing dishes and picking up clutter.   She lives by herself in a 37+ year old trailer. Her family checks on her several times weekly. They would like for her to live at [facility]" in [facility] which is very close to her daughter's occupation. She will be able to check on her mother daily. This is an inside community that serves three meals daily. She'll have an apartment with a refrigerator and microwave. She'll have house keeping come in once weekly. They are needing an FL2 form completed.   Review of Systems  Respiratory: Negative for shortness of breath.   Cardiovascular: Negative for chest pain.  Neurological: Negative for dizziness and headaches.  Psychiatric/Behavioral: The patient is nervous/anxious.        Past Medical History:  Diagnosis Date  . Cataracts, bilateral   . Hyperlipidemia   .  Melanoma ([place])    left nares  . Type 2 diabetes mellitus ([place])      Social History   Socioeconomic History  . Marital status: Widowed    Spouse name: Not on file  . Number of children: Not on file  . Years of education: Not on file  . Highest education level: Not on file  Occupational History  . Not on file  Social Needs  . Financial resource strain: Not on file  . Food insecurity:    Worry: Not on file    Inability: Not on file  . Transportation needs:    Medical: Not on file    Non-medical: Not on file  Tobacco Use  . Smoking status: Never Smoker  . Smokeless tobacco: Never Used  Substance and Sexual Activity  . Alcohol use: No    Alcohol/week: 0.0 oz  . Drug use: Not on file  . Sexual activity: Never  Lifestyle  . Physical activity:    Days per week: Not on file    Minutes per session: Not on file  . Stress: Not on file  Relationships  . Social connections:    Talks on phone: Not on file    Gets together: Not on file    Attends religious service: Not on file    Active member of club or organization: Not on file    Attends meetings of clubs or organizations: Not on file    Relationship status: Not on file  .  Intimate partner violence:    Fear of current or ex partner: Not on file    Emotionally abused: Not on file    Physically abused: Not on file    Forced sexual activity: Not on file  Other Topics Concern  . Not on file  Social History Narrative   Widow   Retired. Once worked in [employer].   Enjoys reading, watching TV, being with her family.   Lives alone.    Past Surgical History:  Procedure Laterality Date  . CATARACT EXTRACTION  2016    Family History  Problem Relation Age of Onset  . Diabetes Father   . Alzheimer's disease Brother     No Known Allergies  Current Outpatient Medications on File Prior to Visit  Medication Sig Dispense Refill  . acetaminophen (TYLENOL) 500 MG tablet Take 1,000 mg by mouth every 6 (six) hours as needed for  moderate pain.     . Cholecalciferol (VITAMIN D3) 2000 units TABS Take 2,000 Units by mouth daily.    . pravastatin (PRAVACHOL) 20 MG tablet TAKE 1 TABLET BY MOUTH EVERY DAY IN THE EVENING FOR CHOLESTEROL 90 tablet 0   No current facility-administered medications on file prior to visit.     BP 110/70   Pulse 66   Temp 98 F (36.7 C) (Oral)   Ht 5\' 4"  (1.626 m)   Wt 145 lb 8 oz (66 kg)   SpO2 98%   BMI 24.98 kg/m    Objective:   Physical Exam  Constitutional: She appears well-nourished.  Cardiovascular: Normal rate and regular rhythm.  Neurological: She is alert.  Follows commands. Cannot provide much for HPI today.  Skin: Skin is warm and dry.  Psychiatric: She has a normal mood and affect. Her behavior is normal.          Assessment & Plan:

## 2017-12-08 NOTE — Patient Instructions (Signed)
We've increased your dose of Lexapro from 5 mg to 10 mg.   [facility] [address]  We will call you once the [facility] Form is ready for pick up.  It was a pleasure to see you today!

## 2017-12-08 NOTE — Assessment & Plan Note (Addendum)
Increased anxiety over the last 1-2 months. Will trial increase in Lexapro dose to 10 mg.  Family will update in a few weeks.  FL2 form completed for independent living. This is an appropriate move for her given decline of self and home care now.

## 2017-12-09 ENCOUNTER — Encounter: Payer: Self-pay | Admitting: Primary Care

## 2017-12-10 ENCOUNTER — Telehealth: Payer: Self-pay | Admitting: Primary Care

## 2017-12-10 NOTE — Telephone Encounter (Signed)
FMLA paperwork in Diane Ali's in box

## 2017-12-10 NOTE — Telephone Encounter (Signed)
Completed and placed on [place] desk. 

## 2017-12-11 NOTE — Telephone Encounter (Signed)
Paperwork faxed Copy for scan Copy for pt Left message letting peggy (daughter) know paperwork has been faxed

## 2017-12-24 ENCOUNTER — Encounter: Payer: Self-pay | Admitting: Primary Care

## 2017-12-24 ENCOUNTER — Telehealth: Payer: Self-pay | Admitting: Primary Care

## 2017-12-24 NOTE — Telephone Encounter (Signed)
Approved.  

## 2017-12-24 NOTE — Telephone Encounter (Signed)
Copied from [place] [phone]. Topic: Quick Communication - See Telephone Encounter >> Dec 24, 2017  1:27 PM Conception Chancy, NT wrote: CRM for notification. See Telephone encounter for: 12/24/17.   Sabino Donovan is calling from [facility] and is requesting verbal orders for speech therapy and occupational therapy. States the patient is having a hard time transitioning to her independent living facility.   Judson Roch [phone]

## 2018-01-01 NOTE — Telephone Encounter (Signed)
Message left for Sarah to return my call.

## 2018-01-02 ENCOUNTER — Other Ambulatory Visit: Payer: Self-pay | Admitting: Internal Medicine

## 2018-01-02 ENCOUNTER — Telehealth: Payer: Self-pay | Admitting: Primary Care

## 2018-01-02 DIAGNOSIS — F32A Depression, unspecified: Secondary | ICD-10-CM

## 2018-01-02 DIAGNOSIS — F329 Major depressive disorder, single episode, unspecified: Secondary | ICD-10-CM

## 2018-01-02 DIAGNOSIS — F419 Anxiety disorder, unspecified: Principal | ICD-10-CM

## 2018-01-02 NOTE — Telephone Encounter (Signed)
[facility] for nursing 1 x week for 1 week, 2 x week for 2 weeks. Social work ordered

## 2018-01-02 NOTE — Telephone Encounter (Signed)
Copied from [place] [id number]. Topic: Quick Communication - See Telephone Encounter >> Jan 02, 2018  2:28 PM Synthia Innocent wrote: CRM for notification. See Telephone encounter for: 01/02/18. Requesting [place] orders for  1x a week for 1 week, 2x a week for 2 weeks, need Dx for anxiety and depression. Order for social worker for next week.

## 2018-01-02 NOTE — Telephone Encounter (Signed)
Spoken to [place] Roch and gave the approval for the verbal orders.

## 2018-01-02 NOTE — Telephone Encounter (Signed)
Message left for Diane Ali to return my call.

## 2018-01-06 NOTE — Telephone Encounter (Signed)
Spoken to [place] and the approval has been for orders below.

## 2018-01-06 NOTE — Telephone Encounter (Signed)
Diane Ali (Other) [phone]   Pt is needing to be seen today and they cannot schedule until VO received. Please call back.

## 2018-01-08 ENCOUNTER — Telehealth: Payer: Self-pay | Admitting: Primary Care

## 2018-01-08 NOTE — Telephone Encounter (Signed)
Approved.  

## 2018-01-08 NOTE — Telephone Encounter (Signed)
Copied from [place] [id number]. Topic: Quick Communication - See Telephone Encounter >> Jan 08, 2018 10:33 AM Cleaster Corin, NT wrote: CRM for notification. See Telephone encounter for: 01/08/18.  Abby calling from [facility] to request verbal orders for pt. For speech therapy. Abby can be reached at [phone]  1 week 1 2 week 3

## 2018-01-09 ENCOUNTER — Telehealth: Payer: Self-pay

## 2018-01-09 NOTE — Telephone Encounter (Signed)
Approved. What's the reason?

## 2018-01-09 NOTE — Telephone Encounter (Signed)
Spoken to Abby and gave her approval of the verbal orders.

## 2018-01-09 NOTE — Telephone Encounter (Signed)
Copied from [place] [id number]. Topic: General - Other >> Jan 09, 2018  9:36 AM Yvette Rack wrote: Reason for CRM: Renee with [facility] called in verbal orders to delay the start for Occupational Therapy for 3 weeks.Cb# [phone]

## 2018-01-12 NOTE — Telephone Encounter (Signed)
Message left for Renee to return my call.

## 2018-01-14 NOTE — Telephone Encounter (Signed)
Message left for Renee to return my call.

## 2018-01-15 DIAGNOSIS — E785 Hyperlipidemia, unspecified: Secondary | ICD-10-CM | POA: Diagnosis not present

## 2018-01-15 DIAGNOSIS — F0390 Unspecified dementia without behavioral disturbance: Secondary | ICD-10-CM | POA: Diagnosis not present

## 2018-01-15 DIAGNOSIS — E119 Type 2 diabetes mellitus without complications: Secondary | ICD-10-CM | POA: Diagnosis not present

## 2018-01-15 DIAGNOSIS — F329 Major depressive disorder, single episode, unspecified: Secondary | ICD-10-CM | POA: Diagnosis not present

## 2018-01-15 DIAGNOSIS — F419 Anxiety disorder, unspecified: Secondary | ICD-10-CM | POA: Diagnosis not present

## 2018-01-15 DIAGNOSIS — Z9181 History of falling: Secondary | ICD-10-CM | POA: Diagnosis not present

## 2018-01-15 DIAGNOSIS — R41841 Cognitive communication deficit: Secondary | ICD-10-CM | POA: Diagnosis not present

## 2018-01-16 ENCOUNTER — Encounter: Payer: Self-pay | Admitting: Primary Care

## 2018-01-20 NOTE — Telephone Encounter (Signed)
Message left for Renee to return my call.

## 2018-01-22 NOTE — Telephone Encounter (Signed)
Noted, thanks for trying. Okay to close out encounter.

## 2018-01-22 NOTE — Telephone Encounter (Signed)
Diane Ali, I have a few times regarding this for patient but did not hear anything back.

## 2018-02-17 ENCOUNTER — Other Ambulatory Visit: Payer: Self-pay | Admitting: Primary Care

## 2018-02-17 DIAGNOSIS — E785 Hyperlipidemia, unspecified: Secondary | ICD-10-CM

## 2018-02-17 MED ORDER — PRAVASTATIN SODIUM 20 MG PO TABS: ORAL_TABLET | ORAL | 0 refills | Status: DC

## 2018-02-26 ENCOUNTER — Other Ambulatory Visit: Payer: Self-pay | Admitting: Primary Care

## 2018-02-26 DIAGNOSIS — E785 Hyperlipidemia, unspecified: Secondary | ICD-10-CM

## 2018-03-03 ENCOUNTER — Other Ambulatory Visit: Payer: Self-pay | Admitting: Primary Care

## 2018-03-03 DIAGNOSIS — F0391 Unspecified dementia with behavioral disturbance: Secondary | ICD-10-CM

## 2018-04-07 ENCOUNTER — Ambulatory Visit (INDEPENDENT_AMBULATORY_CARE_PROVIDER_SITE_OTHER): Payer: Medicare Other | Admitting: Primary Care

## 2018-04-07 ENCOUNTER — Ambulatory Visit (INDEPENDENT_AMBULATORY_CARE_PROVIDER_SITE_OTHER)
Admission: RE | Admit: 2018-04-07 | Discharge: 2018-04-07 | Disposition: A | Discharge status: Home / Self Care | Payer: Medicare Other | Source: Ambulatory Visit | Attending: Primary Care | Admitting: Primary Care

## 2018-04-07 ENCOUNTER — Encounter: Payer: Self-pay | Admitting: Primary Care

## 2018-04-07 VITALS — BP 114/72 | HR 65 | Temp 98.0°F | Ht 64.0 in | Wt 165.8 lb

## 2018-04-07 DIAGNOSIS — G8929 Other chronic pain: Secondary | ICD-10-CM

## 2018-04-07 DIAGNOSIS — M25562 Pain in left knee: Secondary | ICD-10-CM

## 2018-04-07 DIAGNOSIS — E785 Hyperlipidemia, unspecified: Secondary | ICD-10-CM

## 2018-04-07 DIAGNOSIS — F0391 Unspecified dementia with behavioral disturbance: Secondary | ICD-10-CM

## 2018-04-07 NOTE — Progress Notes (Signed)
Subjective:    Patient ID: Diane Ali, female    DOB: 02-05-1934, 84 y.o.   MRN: [id number]  HPI  Mr. Dobbins is an 84 year old female with a history of dementia, osteoarthritis who presents today with a chief complaint of knee pain. Her family is also requesting a prescription for a wheelchair. She is with her daughter who is supplying most of the information for her HPI.  Her left knee has been bothering her for 20+ years, is no worse than usual but her family is concerned given recent falls. She's fallen twice within the last two weeks, last fall being yesterday. She's unsure on how she fell, her family believes her left knee may be giving out. Her family has noticed then the patient's left knee moves inward when she walks.   She resides in an independent living facility. Her family reports that there is an on site physical therapist who works through [employer]. She doesn't use her walker consistently. The walk from her room to the dining hall is far and they believe she would benefit from a wheelchair. Other needs for a wheelchair including going out to restaurants with her family, shopping, and going to and from doctors appointments.  Overall her knee pain is no worse than it was years ago, she denies swelling. She denies hitting her head when falling. She's not taking anything for her symptoms.    Review of Systems  Constitutional: Negative for fever.  Musculoskeletal: Positive for arthralgias.       Chronic left knee pain  Skin: Negative for color change.  Neurological: Negative for weakness and numbness.       Past Medical History:  Diagnosis Date  . Cataracts, bilateral   . Hyperlipidemia   . Melanoma ([place])    left nares  . Type 2 diabetes mellitus ([place])      Social History   Socioeconomic History  . Marital status: Widowed    Spouse name: Not on file  . Number of children: Not on file  . Years of education: Not on file  . Highest education level: Not on file    Occupational History  . Not on file  Social Needs  . Financial resource strain: Not on file  . Food insecurity:    Worry: Not on file    Inability: Not on file  . Transportation needs:    Medical: Not on file    Non-medical: Not on file  Tobacco Use  . Smoking status: Never Smoker  . Smokeless tobacco: Never Used  Substance and Sexual Activity  . Alcohol use: No    Alcohol/week: 0.0 standard drinks  . Drug use: Not on file  . Sexual activity: Never  Lifestyle  . Physical activity:    Days per week: Not on file    Minutes per session: Not on file  . Stress: Not on file  Relationships  . Social connections:    Talks on phone: Not on file    Gets together: Not on file    Attends religious service: Not on file    Active member of club or organization: Not on file    Attends meetings of clubs or organizations: Not on file    Relationship status: Not on file  . Intimate partner violence:    Fear of current or ex partner: Not on file    Emotionally abused: Not on file    Physically abused: Not on file    Forced sexual activity: Not  on file  Other Topics Concern  . Not on file  Social History Narrative   Widow   Retired. Once worked in [employer].   Enjoys reading, watching TV, being with her family.   Lives alone.    Past Surgical History:  Procedure Laterality Date  . CATARACT EXTRACTION  2016    Family History  Problem Relation Age of Onset  . Diabetes Father   . Alzheimer's disease Brother     No Known Allergies  Current Outpatient Medications on File Prior to Visit  Medication Sig Dispense Refill  . acetaminophen (TYLENOL) 500 MG tablet Take 1,000 mg by mouth every 6 (six) hours as needed for moderate pain.     . Cholecalciferol (VITAMIN D3) 2000 units TABS Take 2,000 Units by mouth daily.    Marland Kitchen donepezil (ARICEPT) 5 MG tablet Take 1 tablet (5 mg total) by mouth at bedtime. For memory 90 tablet 3  . escitalopram (LEXAPRO) 10 MG tablet TAKE 1 TABLET BY MOUTH  DAILY FOR ANXIETY AND DEPRESSION. 90 tablet 0  . levothyroxine (SYNTHROID, LEVOTHROID) 25 MCG tablet Take 1 tablet by mouth every morning on an empty stomach with water. No other medications or food for 30 minutes. 90 tablet 2  . pravastatin (PRAVACHOL) 20 MG tablet TAKE 1 TABLET BY MOUTH EVERY DAY IN THE EVENING FOR CHOLESTEROL 90 tablet 0   No current facility-administered medications on file prior to visit.     BP 114/72   Pulse 65   Temp 98 F (36.7 C) (Oral)   Ht 5\' 4"  (1.626 m)   Wt 165 lb 12 oz (75.2 kg)   SpO2 98%   BMI 28.45 kg/m    Objective:   Physical Exam  Constitutional: She appears well-nourished.  Cardiovascular: Normal rate and regular rhythm.  Respiratory: Effort normal and breath sounds normal.  Musculoskeletal:       Left knee: She exhibits normal range of motion and no bony tenderness. No tenderness found.  Skin: Skin is warm and dry. No erythema.  Psychiatric: She has a normal mood and affect.           Assessment & Plan:

## 2018-04-07 NOTE — Assessment & Plan Note (Signed)
Problematic for 20+ years, no acute changes. Check plain films today given falls. Doesn't appear to be weak on exam but may have arthritis causing the knee to give out.   Prescription written for wheelchair to use when traveling or walking long distances. Prescription for home physical therapy provided for strength and balance.

## 2018-04-07 NOTE — Patient Instructions (Signed)
We will fax the prescription for the wheelchair to [facility] in [place] later this afternoon.  Please take the physical therapy prescription to [facility].  Complete xray(s) prior to leaving today. I will notify you of your results once received.  Use your walker when moving around your home, especially when going to eat meals.   It was a pleasure to see you today!

## 2018-04-08 ENCOUNTER — Telehealth: Payer: Self-pay

## 2018-04-08 NOTE — Telephone Encounter (Signed)
Spoke with [employer].  Appointment scheduled with Dr. Lorelei Pont 04/09/2018 at 11:00 am.

## 2018-04-08 NOTE — Telephone Encounter (Signed)
Copied from [place] [id number]. Topic: General - Other >> Apr 08, 2018  3:42 PM Judyann Munson wrote: Reason for CRM:  patient  daughter is calling to schedule a appt with Dr.Copland, advise peggy the only availability  is showing 04-20-18, stated she is needing something sooner for the patient knee pain that has fluid on it. Peggy best contact number is [phone]

## 2018-04-09 ENCOUNTER — Ambulatory Visit: Payer: Medicare Other | Admitting: Family Medicine

## 2018-04-09 ENCOUNTER — Encounter: Payer: Self-pay | Admitting: Family Medicine

## 2018-04-09 VITALS — BP 120/70 | HR 67 | Temp 98.4°F | Ht 64.0 in | Wt 164.5 lb

## 2018-04-09 DIAGNOSIS — G8929 Other chronic pain: Secondary | ICD-10-CM | POA: Diagnosis not present

## 2018-04-09 DIAGNOSIS — M25562 Pain in left knee: Secondary | ICD-10-CM | POA: Diagnosis not present

## 2018-04-09 DIAGNOSIS — M1712 Unilateral primary osteoarthritis, left knee: Secondary | ICD-10-CM

## 2018-04-09 MED ORDER — METHYLPREDNISOLONE ACETATE 40 MG/ML IJ SUSP
80.0000 mg | Freq: Once | INTRAMUSCULAR | Status: AC
  Administered 2018-04-09: 80 mg via INTRA_ARTICULAR

## 2018-04-09 NOTE — Progress Notes (Signed)
Dr. Frederico Hamman T. Josslin Sanjuan, MD, [facility] [place], [address] Phone: [phone] Fax: [phone]  04/09/2018  Patient: Diane Ali, MRN: [id number], DOB: 1934-02-16, 84 y.o.  Primary Physician:  Pleas Koch, NP   Chief Complaint  Patient presents with  . Knee Pain    Left   Subjective:   Diane Ali is a 84 y.o. very pleasant female patient who presents with the following:  L knee pain eval: She is a pleasant lady with dementia.  Additional history is provided by both of her daughters.  She has been having problems with her knee off and on for years, but she has had some worsening pain recently and she has had a fusion over the last couple of weeks.  Seems to be bothering her quite a bit more than her normal baseline.  Additionally she has had 2 recent falls in the last several weeks.  Exact mechanism was not entirely clear.  She is also scheduled to do some physical therapy at the independent living facility where she lives.  Falled twice.  l knee asp 30 cc inj  Past Medical History, Surgical History, Social History, Family History, Problem List, Medications, and Allergies have been reviewed and updated if relevant.  Patient Active Problem List   Diagnosis Date Noted  . Chronic pain of left knee 04/07/2018  . Osteoarthritis 06/04/2017  . Dementia with behavioral disturbance 01/29/2017  . Preventative health care 02/26/2016  . Diabetes type 2, controlled ([place]) 02/21/2015  . Hyperlipidemia LDL goal <100 02/21/2015  . Hypothyroidism 02/21/2015  . Medicare annual wellness visit, subsequent 02/21/2015  . Vitamin D deficiency 02/21/2015    Past Medical History:  Diagnosis Date  . Cataracts, bilateral   . Hyperlipidemia   . Melanoma ([place])    left nares  . Type 2 diabetes mellitus ([place])     Past Surgical History:  Procedure Laterality Date  . CATARACT EXTRACTION  2016    Social History     Socioeconomic History  . Marital status: Widowed    Spouse name: Not on file  . Number of children: Not on file  . Years of education: Not on file  . Highest education level: Not on file  Occupational History  . Not on file  Social Needs  . Financial resource strain: Not on file  . Food insecurity:    Worry: Not on file    Inability: Not on file  . Transportation needs:    Medical: Not on file    Non-medical: Not on file  Tobacco Use  . Smoking status: Never Smoker  . Smokeless tobacco: Never Used  Substance and Sexual Activity  . Alcohol use: No    Alcohol/week: 0.0 standard drinks  . Drug use: Not on file  . Sexual activity: Never  Lifestyle  . Physical activity:    Days per week: Not on file    Minutes per session: Not on file  . Stress: Not on file  Relationships  . Social connections:    Talks on phone: Not on file    Gets together: Not on file    Attends religious service: Not on file    Active member of club or organization: Not on file    Attends meetings of clubs or organizations: Not on file    Relationship status: Not on file  . Intimate partner violence:    Fear of current or ex partner: Not on  file    Emotionally abused: Not on file    Physically abused: Not on file    Forced sexual activity: Not on file  Other Topics Concern  . Not on file  Social History Narrative   Widow   Retired. Once worked in [employer].   Enjoys reading, watching TV, being with her family.   Lives alone.    Family History  Problem Relation Age of Onset  . Diabetes Father   . Alzheimer's disease Brother     No Known Allergies  Medication list reviewed and updated in full in [place].  GEN: No fevers, chills. Nontoxic. Primarily MSK c/o today. MSK: Detailed in the HPI GI: tolerating PO intake without difficulty Neuro: No numbness, parasthesias, or tingling associated. Otherwise the pertinent positives of the ROS are noted above.   Objective:   BP  120/70   Pulse 67   Temp 98.4 F (36.9 C) (Oral)   Ht 5\' 4"  (1.626 m)   Wt 164 lb 8 oz (74.6 kg)   BMI 28.24 kg/m     GEN: WDWN, NAD, Non-toxic HEENT: Atraumatic, Normocephalic.  Ears and Nose: No external deformity. EXTR: No clubbing/cyanosis/edema NEURO: pleasantly demented. PSYCH: Normally interactive. Conversant. Not depressed or anxious appearing.  Calm demeanor.    With ambulation she does have a valgus change to her knee, and she does walk with a limp.  She has a mild to moderate effusion. Essentially no movement of the patella is possible.  She has mild medial and lateral joint line tenderness. Lachman is negative.  ACL and PCL both appear intact.  Stable to varus and valgus stress. Patient does have some pain with flexion pinch testing as well as McMurray's, but there is no mechanical symptoms with McMurray's.  Radiology: Dg Knee Complete 4 Views Left  Result Date: 04/07/2018 CLINICAL DATA:  chronic knee pain, left EXAM: LEFT KNEE - COMPLETE 4+ VIEW COMPARISON:  None. FINDINGS: There are degenerative changes in the knee, and with chondrocalcinosis and LATERAL joint space narrowing. No acute fracture. There is a small joint effusion. Note is made of a small exostosis along the MEDIAL distal aspect of the femur. There is dense atherosclerotic calcification of the popliteal artery. IMPRESSION: Joint effusion and degenerative changes.  No acute fracture. Electronically Signed   By: Nolon Nations M.D.   On: 04/07/2018 17:17    Assessment and Plan:   Primary osteoarthritis of one knee, left  Chronic pain of left knee - Plan: methylPREDNISolone acetate (DEPO-MEDROL) injection 80 mg  Primary localized osteoarthrosis of left lower leg  Probable arthritis exacerbation secondary to fall.  She does have an effusion currently, and were going to aspirate this today to see if we can go ahead and get her some relief.  I did encourage them to keep having her walk with a walker or  with assistance.  I think is reasonable to use a wheelchair for appointments and when going out on town, but if she uses her wheelchair most of the time, she will likely rapidly decrease her ability to ambulate.  Knee Aspiration and Injection, L Patient verbally consented; risks, benefits, and alternatives explained including possible infection. Patient prepped with Chloraprep. Ethyl chloride for anesthesia. 10 cc of 1% Lidocaine used in wheal then injected Subcutaneous fashion with 22 gauge needle on lateral approach. Under sterilne conditions, 18 gauge needle used via lateral approach to aspirate 30 cc of yellowish synovial fluid. Then 8 cc of Lidocaine 1% and 2 mL of  Depo-Medrol 40 mg injected. Tolerated well, decreased pain, no complications.   Follow-up: prn only  Meds ordered this encounter  Medications  . methylPREDNISolone acetate (DEPO-MEDROL) injection 80 mg   Signed,  Cylah Fannin T. Hiyab Nhem, MD   Allergies as of 04/09/2018   No Known Allergies     Medication List        Accurate as of 04/09/18 11:59 PM. Always use your most recent med list.          acetaminophen 500 MG tablet Commonly known as:  TYLENOL Take 1,000 mg by mouth every 6 (six) hours as needed for moderate pain.   donepezil 5 MG tablet Commonly known as:  ARICEPT Take 1 tablet (5 mg total) by mouth at bedtime. For memory   escitalopram 10 MG tablet Commonly known as:  LEXAPRO TAKE 1 TABLET BY MOUTH DAILY FOR ANXIETY AND DEPRESSION.   levothyroxine 25 MCG tablet Commonly known as:  SYNTHROID, LEVOTHROID Take 1 tablet by mouth every morning on an empty stomach with water. No other medications or food for 30 minutes.   pravastatin 20 MG tablet Commonly known as:  PRAVACHOL TAKE 1 TABLET BY MOUTH EVERY DAY IN THE EVENING FOR CHOLESTEROL   Vitamin D3 2000 units Tabs Take 2,000 Units by mouth daily.

## 2018-05-04 ENCOUNTER — Ambulatory Visit: Payer: Medicare Other | Admitting: Primary Care

## 2018-05-05 ENCOUNTER — Telehealth: Payer: Self-pay

## 2018-05-05 NOTE — Telephone Encounter (Signed)
Copied from [place] [id number]. Topic: Quick Communication - See Telephone Encounter >> May 05, 2018  3:14 PM Antonieta Iba C wrote: CRM for notification. See Telephone encounter for: 05/05/18.  Hollie w/ [facility] [phone]   Called in to follow up, she says that they received order form back but noticed that PT, OT and Speech was checked off.. She would like to have clarity, is pt needing all services? They were just aware of PT . ALSO she would like to know if left knee pain is due to chronic arthritis ?

## 2018-05-05 NOTE — Telephone Encounter (Signed)
Please notify Hollie that PT is fine, if PT recommends any additional therapy then okay to proceed. Yes, the chronic left knee pain is secondary to osteoarthritis and needs PT for that. She received a cortisone injection in our office in late August.

## 2018-05-06 NOTE — Telephone Encounter (Signed)
Alex from [facility] is calling.  They need last OV note faxed to [phone]

## 2018-05-06 NOTE — Telephone Encounter (Signed)
Spoken and notified Hollie of Tawni Millers comments. Hollie verbalized understanding.

## 2018-05-07 ENCOUNTER — Telehealth: Payer: Self-pay | Admitting: Primary Care

## 2018-05-07 NOTE — Telephone Encounter (Signed)
Copied from [place] [id number]. Topic: Quick Communication - See Telephone Encounter >> May 07, 2018  4:52 PM Sheran Luz wrote: CRM for notification. See Telephone encounter for: 05/07/18.  [name] from [facility] calling to request verbal orders for PT for 2xw for 3w 1xw for 3w. Please advise.

## 2018-05-07 NOTE — Telephone Encounter (Signed)
Approved.  

## 2018-05-08 NOTE — Telephone Encounter (Signed)
Message left for [name] to return my call.

## 2018-05-08 NOTE — Telephone Encounter (Signed)
[name] calling back and aware Diane Ali has approved PT orders for the pt with frequency requested.

## 2018-05-08 NOTE — Telephone Encounter (Signed)
Notes has been faxed over to the number below.

## 2018-05-13 ENCOUNTER — Ambulatory Visit: Payer: Medicare Other | Admitting: Primary Care

## 2018-05-13 ENCOUNTER — Encounter: Payer: Self-pay | Admitting: Primary Care

## 2018-05-13 ENCOUNTER — Other Ambulatory Visit: Payer: Self-pay | Admitting: Primary Care

## 2018-05-13 VITALS — BP 122/68 | HR 72 | Temp 98.0°F | Ht 64.0 in | Wt 167.5 lb

## 2018-05-13 DIAGNOSIS — F0391 Unspecified dementia with behavioral disturbance: Secondary | ICD-10-CM

## 2018-05-13 DIAGNOSIS — E119 Type 2 diabetes mellitus without complications: Secondary | ICD-10-CM

## 2018-05-13 DIAGNOSIS — E785 Hyperlipidemia, unspecified: Secondary | ICD-10-CM | POA: Diagnosis not present

## 2018-05-13 DIAGNOSIS — Z23 Encounter for immunization: Secondary | ICD-10-CM

## 2018-05-13 LAB — HEMOGLOBIN A1C: Hgb A1c MFr Bld: 6.5 % (ref 4.6–6.5)

## 2018-05-13 LAB — MICROALBUMIN / CREATININE URINE RATIO
CREATININE, U: 79.7 mg/dL
MICROALB UR: 0.9 mg/dL (ref 0.0–1.9)
MICROALB/CREAT RATIO: 1.1 mg/g (ref 0.0–30.0)

## 2018-05-13 LAB — COMPREHENSIVE METABOLIC PANEL
ALBUMIN: 4 g/dL (ref 3.5–5.2)
ALT: 14 U/L (ref 0–35)
AST: 22 U/L (ref 0–37)
Alkaline Phosphatase: 53 U/L (ref 39–117)
BUN: 28 mg/dL — AB (ref 6–23)
CALCIUM: 9.8 mg/dL (ref 8.4–10.5)
CHLORIDE: 104 meq/L (ref 96–112)
CO2: 30 meq/L (ref 19–32)
CREATININE: 1.07 mg/dL (ref 0.40–1.20)
GFR: 52.19 mL/min — ABNORMAL LOW (ref >60.00)
Glucose, Bld: 126 mg/dL — ABNORMAL HIGH (ref 70–99)
POTASSIUM: 4.9 meq/L (ref 3.5–5.1)
SODIUM: 139 meq/L (ref 135–145)
Total Bilirubin: 0.6 mg/dL (ref 0.2–1.2)
Total Protein: 6.9 g/dL (ref 6.0–8.3)

## 2018-05-13 LAB — LIPID PANEL
CHOLESTEROL: 170 mg/dL (ref 0–200)
HDL: 44.2 mg/dL (ref >39.00)
LDL CALC: 106 mg/dL — AB (ref 0–99)
NonHDL: 125.95
TRIGLYCERIDES: 98 mg/dL (ref 0.0–149.0)
Total CHOL/HDL Ratio: 4
VLDL: 19.6 mg/dL (ref 0.0–40.0)

## 2018-05-13 MED ORDER — ZOSTER VAC RECOMB ADJUVANTED 50 MCG/0.5ML IM SUSR: 0.5000 mL | Freq: Once | INTRAMUSCULAR | 1 refills | Status: AC

## 2018-05-13 MED ORDER — PRAVASTATIN SODIUM 40 MG PO TABS: ORAL_TABLET | ORAL | 3 refills | Status: DC

## 2018-05-13 NOTE — Progress Notes (Signed)
Subjective:    Patient ID: Diane Ali, female    DOB: June 05, 1934, 84 y.o.   MRN: [id number]  HPI  Diane Ali is an 84 year old female who presents today for follow up.  1) Hyperlipidemia: Currently managed on pravastatin 20 mg. Her last lipid panel was in December 2018 with LDL of 109. She is due for repeat lipids today. She denies myalgias.   2) Type 2 Diabetes:  Current medications include: None  She does not check her blood sugars.   Last A1C: 6.3 in March 2019 Last Eye Exam: Due, she will schedule Pneumonia Vaccination: Completed in 2017 ACE/ARB: Urine microalbumin due Statin: Pravastatin   3) Hypothyroidism: Currently managed on levothyroxine 25 mcg. Her last TSH was 3.23 in March 2019. She is compliant to her levothyroxine daily.   4) Dementia: Currently managed on donepezil 5 mg and Lexapro 10 mg. Also residing in independent living and doing well. Her daughter reports that she's been in a very pleasant mood and has is more active in the independent living facility. She denies recent falls and is using her walker.   Wt Readings from Last 3 Encounters:  05/13/18 167 lb 8 oz (76 kg)  04/09/18 164 lb 8 oz (74.6 kg)  04/07/18 165 lb 12 oz (75.2 kg)     BP Readings from Last 3 Encounters:  05/13/18 122/68  04/09/18 120/70  04/07/18 114/72     Review of Systems  Constitutional: Negative for fatigue and unexpected weight change.  Respiratory: Negative for shortness of breath.   Cardiovascular: Negative for chest pain.  Neurological: Negative for dizziness, weakness and headaches.  Psychiatric/Behavioral: Negative for agitation. The patient is not nervous/anxious.        Past Medical History:  Diagnosis Date  . Cataracts, bilateral   . Hyperlipidemia   . Melanoma ([place])    left nares  . Type 2 diabetes mellitus ([place])      Social History   Socioeconomic History  . Marital status: Widowed    Spouse name: Not on file  . Number of children: Not on file    . Years of education: Not on file  . Highest education level: Not on file  Occupational History  . Not on file  Social Needs  . Financial resource strain: Not on file  . Food insecurity:    Worry: Not on file    Inability: Not on file  . Transportation needs:    Medical: Not on file    Non-medical: Not on file  Tobacco Use  . Smoking status: Never Smoker  . Smokeless tobacco: Never Used  Substance and Sexual Activity  . Alcohol use: No    Alcohol/week: 0.0 standard drinks  . Drug use: Not on file  . Sexual activity: Never  Lifestyle  . Physical activity:    Days per week: Not on file    Minutes per session: Not on file  . Stress: Not on file  Relationships  . Social connections:    Talks on phone: Not on file    Gets together: Not on file    Attends religious service: Not on file    Active member of club or organization: Not on file    Attends meetings of clubs or organizations: Not on file    Relationship status: Not on file  . Intimate partner violence:    Fear of current or ex partner: Not on file    Emotionally abused: Not on file  Physically abused: Not on file    Forced sexual activity: Not on file  Other Topics Concern  . Not on file  Social History Narrative   Widow   Retired. Once worked in [employer].   Enjoys reading, watching TV, being with her family.   Lives alone.    Past Surgical History:  Procedure Laterality Date  . CATARACT EXTRACTION  2016    Family History  Problem Relation Age of Onset  . Diabetes Father   . Alzheimer's disease Brother     No Known Allergies  Current Outpatient Medications on File Prior to Visit  Medication Sig Dispense Refill  . acetaminophen (TYLENOL) 500 MG tablet Take 1,000 mg by mouth every 6 (six) hours as needed for moderate pain.     . Cholecalciferol (VITAMIN D3) 2000 units TABS Take 2,000 Units by mouth daily.    Marland Kitchen donepezil (ARICEPT) 5 MG tablet Take 1 tablet (5 mg total) by mouth at bedtime. For  memory 90 tablet 3  . escitalopram (LEXAPRO) 10 MG tablet TAKE 1 TABLET BY MOUTH DAILY FOR ANXIETY AND DEPRESSION. 90 tablet 0  . levothyroxine (SYNTHROID, LEVOTHROID) 25 MCG tablet Take 1 tablet by mouth every morning on an empty stomach with water. No other medications or food for 30 minutes. 90 tablet 2  . pravastatin (PRAVACHOL) 20 MG tablet TAKE 1 TABLET BY MOUTH EVERY DAY IN THE EVENING FOR CHOLESTEROL 90 tablet 0   No current facility-administered medications on file prior to visit.     BP 122/68   Pulse 72   Temp 98 F (36.7 C) (Oral)   Ht 5\' 4"  (1.626 m)   Wt 167 lb 8 oz (76 kg)   SpO2 97%   BMI 28.75 kg/m    Objective:   Physical Exam  Constitutional: She is oriented to person, place, and time. She appears well-nourished.  Neck: Neck supple.  Cardiovascular: Normal rate and regular rhythm.  Respiratory: Effort normal and breath sounds normal.  Neurological: She is alert and oriented to person, place, and time.  Follows commands  Skin: Skin is warm and dry.  Psychiatric: She has a normal mood and affect.           Assessment & Plan:

## 2018-05-13 NOTE — Addendum Note (Signed)
Addended by: Jacqualin Combes on: 05/13/2018 11:57 AM   Modules accepted: Orders

## 2018-05-13 NOTE — Assessment & Plan Note (Signed)
Repeat lipids pending. Continue pravastatin.  

## 2018-05-13 NOTE — Patient Instructions (Signed)
Take the shingles vaccination to your pharmacy for administration.  Stop by the lab prior to leaving today. I will notify you of your results once received.   Continue to remain active and eat a balanced diet.   Please schedule a follow up appointment in 6 months.   It was a pleasure to see you today!

## 2018-05-13 NOTE — Assessment & Plan Note (Signed)
Appears to be doing very well, suspect the transition to independent living has made the most difference. Continue Lexapro and donepezil.

## 2018-05-13 NOTE — Assessment & Plan Note (Signed)
Repeat A1C pending. Continue off of medications for now. Pneumonia vaccination UTD. Managed on statin. Urine microalbumin pending. Follow up in 6 months.

## 2018-05-15 ENCOUNTER — Telehealth: Payer: Self-pay | Admitting: Primary Care

## 2018-05-15 DIAGNOSIS — E785 Hyperlipidemia, unspecified: Secondary | ICD-10-CM

## 2018-05-15 DIAGNOSIS — F0391 Unspecified dementia with behavioral disturbance: Secondary | ICD-10-CM

## 2018-05-15 DIAGNOSIS — E039 Hypothyroidism, unspecified: Secondary | ICD-10-CM

## 2018-05-15 DIAGNOSIS — F0390 Unspecified dementia without behavioral disturbance: Secondary | ICD-10-CM

## 2018-05-15 NOTE — Telephone Encounter (Signed)
Copied from [place] [id number]. Topic: Quick Communication - Rx Refill/Question >> May 15, 2018  1:45 PM Diane Ali wrote: Medication: Cholecalciferol (VITAMIN D3) 2000 units TABS    donepezil (ARICEPT) 5 MG tablet   escitalopram (LEXAPRO) 10 MG tablet   levothyroxine (SYNTHROID, LEVOTHROID) 25 MCG tablet   pravastatin (PRAVACHOL) 40 MG tablet     Has the patient contacted their pharmacy? Yes pharmacy called (Agent: If no, request that the patient contact the pharmacy for the refill.) (Agent: If yes, when and what did the pharmacy advise?)  Preferred Pharmacy (with phone number or street name):     [facility], [address] [phone] (Phone) [phone] (Fax)    Agent: Please be advised that RX refills may take up to 3 business days. We ask that you follow-up with your pharmacy.

## 2018-05-18 MED ORDER — DONEPEZIL HCL 5 MG PO TABS: 5.0000 mg | ORAL_TABLET | Freq: Every day | ORAL | 1 refills | Status: DC

## 2018-05-18 MED ORDER — LEVOTHYROXINE SODIUM 25 MCG PO TABS: ORAL_TABLET | ORAL | 1 refills | Status: DC

## 2018-05-18 MED ORDER — ESCITALOPRAM OXALATE 10 MG PO TABS: 10.0000 mg | ORAL_TABLET | Freq: Every day | ORAL | 1 refills | Status: DC

## 2018-05-18 MED ORDER — VITAMIN D3 50 MCG (2000 UT) PO TABS: 2000.0000 [IU] | ORAL_TABLET | Freq: Every day | ORAL | 1 refills | Status: DC

## 2018-05-18 MED ORDER — PRAVASTATIN SODIUM 40 MG PO TABS: ORAL_TABLET | ORAL | 1 refills | Status: DC

## 2018-05-18 NOTE — Addendum Note (Signed)
Addended by: Jacqualin Combes on: 05/18/2018 05:20 PM   Modules accepted: Orders

## 2018-05-18 NOTE — Telephone Encounter (Signed)
Refills has been send

## 2018-05-18 NOTE — Addendum Note (Signed)
Addended by: Jacqualin Combes on: 05/18/2018 04:55 PM   Modules accepted: Orders

## 2018-05-26 ENCOUNTER — Other Ambulatory Visit: Payer: Self-pay | Admitting: Primary Care

## 2018-05-26 DIAGNOSIS — F0391 Unspecified dementia with behavioral disturbance: Secondary | ICD-10-CM

## 2018-06-09 ENCOUNTER — Telehealth: Payer: Self-pay | Admitting: Primary Care

## 2018-06-09 NOTE — Telephone Encounter (Signed)
Diane Ali. Thanks!

## 2018-06-09 NOTE — Telephone Encounter (Signed)
fmla paperwork in Diane Ali's in box °

## 2018-06-10 NOTE — Telephone Encounter (Signed)
Completed and placed on [place] desk. 

## 2018-06-11 NOTE — Telephone Encounter (Signed)
Paperwork faxed Left message asking peggy to call the office  Copy for pt Copy for scan

## 2018-06-28 ENCOUNTER — Other Ambulatory Visit: Payer: Self-pay | Admitting: Primary Care

## 2018-06-28 DIAGNOSIS — F0391 Unspecified dementia with behavioral disturbance: Secondary | ICD-10-CM

## 2018-10-15 ENCOUNTER — Other Ambulatory Visit: Payer: Self-pay | Admitting: Primary Care

## 2018-10-15 DIAGNOSIS — E785 Hyperlipidemia, unspecified: Secondary | ICD-10-CM

## 2018-10-15 DIAGNOSIS — F0391 Unspecified dementia with behavioral disturbance: Secondary | ICD-10-CM

## 2018-10-15 DIAGNOSIS — E559 Vitamin D deficiency, unspecified: Secondary | ICD-10-CM

## 2018-10-15 DIAGNOSIS — E039 Hypothyroidism, unspecified: Secondary | ICD-10-CM

## 2018-10-15 DIAGNOSIS — F0390 Unspecified dementia without behavioral disturbance: Secondary | ICD-10-CM

## 2018-10-16 NOTE — Telephone Encounter (Signed)
Noted.  Refill sent to pharmacy. 

## 2018-11-04 ENCOUNTER — Other Ambulatory Visit: Payer: Self-pay | Admitting: Primary Care

## 2018-11-04 NOTE — Telephone Encounter (Signed)
Left message for patient's daughter to return my call to reschedule appointment.

## 2018-11-12 ENCOUNTER — Telehealth: Payer: Self-pay

## 2018-11-12 NOTE — Telephone Encounter (Signed)
Diane Ali (DPR signed) who is pts caregiver said FMLA papers faxed to [facility] 11/12/18. Diane Ali request cb when completed.

## 2018-11-13 ENCOUNTER — Ambulatory Visit: Payer: Medicare Other | Admitting: Primary Care

## 2018-11-13 NOTE — Telephone Encounter (Signed)
Completed and handed to Robin. °

## 2018-11-13 NOTE — Telephone Encounter (Signed)
Paperwork in Diane Ali's in box for review and signature

## 2018-11-13 NOTE — Telephone Encounter (Signed)
Left message asking peggy (daughter) to call office.  I did receive FMLA paperwork  I need to know the start date for [facility]

## 2018-11-17 NOTE — Telephone Encounter (Signed)
Left message letting peggy know paperwork has been faxed Copy for pt Copy for scan

## 2019-01-19 ENCOUNTER — Ambulatory Visit: Payer: Medicare Other | Admitting: Primary Care

## 2019-02-10 ENCOUNTER — Other Ambulatory Visit: Payer: Self-pay | Admitting: Primary Care

## 2019-02-10 DIAGNOSIS — E119 Type 2 diabetes mellitus without complications: Secondary | ICD-10-CM

## 2019-02-10 DIAGNOSIS — E785 Hyperlipidemia, unspecified: Secondary | ICD-10-CM

## 2019-02-10 DIAGNOSIS — E039 Hypothyroidism, unspecified: Secondary | ICD-10-CM

## 2019-02-11 ENCOUNTER — Other Ambulatory Visit (INDEPENDENT_AMBULATORY_CARE_PROVIDER_SITE_OTHER): Payer: Medicare Other

## 2019-02-11 DIAGNOSIS — E039 Hypothyroidism, unspecified: Secondary | ICD-10-CM

## 2019-02-11 DIAGNOSIS — Z0000 Encounter for general adult medical examination without abnormal findings: Secondary | ICD-10-CM

## 2019-02-11 DIAGNOSIS — E785 Hyperlipidemia, unspecified: Secondary | ICD-10-CM

## 2019-02-11 DIAGNOSIS — E119 Type 2 diabetes mellitus without complications: Secondary | ICD-10-CM

## 2019-02-11 NOTE — Addendum Note (Signed)
Addended by: Cloyd Stagers on: 02/11/2019 03:39 PM   Modules accepted: Orders

## 2019-02-11 NOTE — Addendum Note (Signed)
Addended by: Cloyd Stagers on: 02/11/2019 03:36 PM   Modules accepted: Orders

## 2019-02-12 LAB — BASIC METABOLIC PANEL WITH GFR
BUN/Creatinine Ratio: 18 (calc) (ref 6–22)
BUN: 23 mg/dL (ref 7–25)
CO2: 27 mmol/L (ref 20–32)
Calcium: 9.3 mg/dL (ref 8.6–10.4)
Chloride: 105 mmol/L (ref 98–110)
Creat: 1.26 mg/dL — ABNORMAL HIGH (ref 0.60–0.88)
GFR, Est African American: 46 mL/min/{1.73_m2} — ABNORMAL LOW (ref >=60)
GFR, Est Non African American: 40 mL/min/{1.73_m2} — ABNORMAL LOW (ref >=60)
Glucose, Bld: 213 mg/dL — ABNORMAL HIGH (ref 65–99)
Potassium: 4.8 mmol/L (ref 3.5–5.3)
Sodium: 139 mmol/L (ref 135–146)

## 2019-02-12 LAB — LIPID PANEL
Cholesterol: 150 mg/dL (ref <200)
HDL: 37 mg/dL — ABNORMAL LOW (ref >=50)
LDL Cholesterol (Calc): 89 mg/dL (calc)
Non-HDL Cholesterol (Calc): 113 mg/dL (calc) (ref <130)
Total CHOL/HDL Ratio: 4.1 (calc) (ref <5.0)
Triglycerides: 141 mg/dL (ref <150)

## 2019-02-12 LAB — HEMOGLOBIN A1C
Hgb A1c MFr Bld: 6.7 % of total Hgb — ABNORMAL HIGH (ref <5.7)
Mean Plasma Glucose: 146 (calc)
eAG (mmol/L): 8.1 (calc)

## 2019-03-30 ENCOUNTER — Telehealth: Payer: Self-pay | Admitting: Primary Care

## 2019-03-30 NOTE — Telephone Encounter (Signed)
Opened in error

## 2019-04-26 ENCOUNTER — Ambulatory Visit: Payer: Medicare Other | Admitting: Primary Care

## 2019-04-26 ENCOUNTER — Ambulatory Visit (INDEPENDENT_AMBULATORY_CARE_PROVIDER_SITE_OTHER): Payer: Medicare Other | Admitting: Primary Care

## 2019-04-26 ENCOUNTER — Encounter: Payer: Self-pay | Admitting: Primary Care

## 2019-04-26 ENCOUNTER — Other Ambulatory Visit: Payer: Self-pay

## 2019-04-26 VITALS — BP 124/76 | HR 82 | Temp 98.1°F | Ht 64.0 in | Wt 173.0 lb

## 2019-04-26 DIAGNOSIS — Z23 Encounter for immunization: Secondary | ICD-10-CM

## 2019-04-26 DIAGNOSIS — E119 Type 2 diabetes mellitus without complications: Secondary | ICD-10-CM

## 2019-04-26 DIAGNOSIS — F0391 Unspecified dementia with behavioral disturbance: Secondary | ICD-10-CM | POA: Diagnosis not present

## 2019-04-26 DIAGNOSIS — F0390 Unspecified dementia without behavioral disturbance: Secondary | ICD-10-CM | POA: Diagnosis not present

## 2019-04-26 DIAGNOSIS — E039 Hypothyroidism, unspecified: Secondary | ICD-10-CM | POA: Diagnosis not present

## 2019-04-26 DIAGNOSIS — E785 Hyperlipidemia, unspecified: Secondary | ICD-10-CM

## 2019-04-26 LAB — COMPREHENSIVE METABOLIC PANEL
ALT: 8 U/L (ref 0–35)
AST: 17 U/L (ref 0–37)
Albumin: 4.1 g/dL (ref 3.5–5.2)
Alkaline Phosphatase: 47 U/L (ref 39–117)
BUN: 24 mg/dL — ABNORMAL HIGH (ref 6–23)
CO2: 27 mEq/L (ref 19–32)
Calcium: 10.2 mg/dL (ref 8.4–10.5)
Chloride: 103 mEq/L (ref 96–112)
Creatinine, Ser: 1.24 mg/dL — ABNORMAL HIGH (ref 0.40–1.20)
GFR: 41.33 mL/min — ABNORMAL LOW (ref >60.00)
Glucose, Bld: 131 mg/dL — ABNORMAL HIGH (ref 70–99)
Potassium: 4.2 mEq/L (ref 3.5–5.1)
Sodium: 140 mEq/L (ref 135–145)
Total Bilirubin: 0.7 mg/dL (ref 0.2–1.2)
Total Protein: 6.9 g/dL (ref 6.0–8.3)

## 2019-04-26 LAB — MICROALBUMIN / CREATININE URINE RATIO
Creatinine,U: 182.4 mg/dL
Microalb Creat Ratio: 1.2 mg/g (ref 0.0–30.0)
Microalb, Ur: 2.2 mg/dL — ABNORMAL HIGH (ref 0.0–1.9)

## 2019-04-26 LAB — TSH: TSH: 4.05 u[IU]/mL (ref 0.35–4.50)

## 2019-04-26 MED ORDER — LEVOTHYROXINE SODIUM 25 MCG PO TABS: ORAL_TABLET | ORAL | 3 refills | Status: DC

## 2019-04-26 MED ORDER — DONEPEZIL HCL 5 MG PO TABS: ORAL_TABLET | ORAL | 3 refills | Status: DC

## 2019-04-26 MED ORDER — ESCITALOPRAM OXALATE 10 MG PO TABS: 10.0000 mg | ORAL_TABLET | Freq: Every day | ORAL | 3 refills | Status: DC

## 2019-04-26 MED ORDER — PRAVASTATIN SODIUM 40 MG PO TABS: ORAL_TABLET | ORAL | 3 refills | Status: DC

## 2019-04-26 NOTE — Assessment & Plan Note (Signed)
No changes per daughter, doing well overall.  Continue donepezil and Lexapro.

## 2019-04-26 NOTE — Addendum Note (Signed)
Addended by: Jacqualin Combes on: 04/26/2019 12:29 PM   Modules accepted: Orders

## 2019-04-26 NOTE — Progress Notes (Signed)
Subjective:    Patient ID: Diane Ali, female    DOB: 29-Nov-1933, 85 y.o.   MRN: [id number]  HPI  Diane Ali is a 85 year old female who presents today for follow up of chronic conditions.   1) Type 2 Diabetes:  Current medications include: None  She is checking her blood glucose 0 times daily and is getting readings of  Last A1C: 6.7 in July 2020 Last Eye Exam: Completed in January 2020 Last Foot Exam:  UTD. Pneumonia Vaccination: Completed in 2017 ACE/ARB: urine microalbumin negative in October 2019 Statin: pravastatin   2) Dementia with Behavorial Disturbance: Currently managed on donepezil 5 mg, Lexapro 10 mg. Her daughter is with her today who endorses no changes overall in memory. She denies anxiety and depression. Overall doing well.  3) Hypothyroidism: Currently managed on levothyroxine 25 mcg. Her last TSH was 3.23 in March 2019. She is taking her levothyroxine every morning with water only. She does take with her Lexapro but her daughter understands to separate her levothyroxine from other medications for 30 minutes.   BP Readings from Last 3 Encounters:  04/26/19 124/76  05/13/18 122/68  04/09/18 120/70     Review of Systems  Respiratory: Negative for shortness of breath.   Cardiovascular: Negative for chest pain.  Neurological: Negative for dizziness and headaches.  Psychiatric/Behavioral: The patient is not nervous/anxious.        No changes in memory loss       Past Medical History:  Diagnosis Date  . Cataracts, bilateral   . Hyperlipidemia   . Melanoma ([place])    left nares  . Type 2 diabetes mellitus ([place])      Social History   Socioeconomic History  . Marital status: Widowed    Spouse name: Not on file  . Number of children: Not on file  . Years of education: Not on file  . Highest education level: Not on file  Occupational History  . Not on file  Social Needs  . Financial resource strain: Not on file  . Food insecurity    Worry:  Not on file    Inability: Not on file  . Transportation needs    Medical: Not on file    Non-medical: Not on file  Tobacco Use  . Smoking status: Never Smoker  . Smokeless tobacco: Never Used  Substance and Sexual Activity  . Alcohol use: No    Alcohol/week: 0.0 standard drinks  . Drug use: Not on file  . Sexual activity: Never  Lifestyle  . Physical activity    Days per week: Not on file    Minutes per session: Not on file  . Stress: Not on file  Relationships  . Social Herbalist on phone: Not on file    Gets together: Not on file    Attends religious service: Not on file    Active member of club or organization: Not on file    Attends meetings of clubs or organizations: Not on file    Relationship status: Not on file  . Intimate partner violence    Fear of current or ex partner: Not on file    Emotionally abused: Not on file    Physically abused: Not on file    Forced sexual activity: Not on file  Other Topics Concern  . Not on file  Social History Narrative   Widow   Retired. Once worked in [employer].   Enjoys reading, watching TV,  being with her family.   Lives alone.    Past Surgical History:  Procedure Laterality Date  . CATARACT EXTRACTION  2016    Family History  Problem Relation Age of Onset  . Diabetes Father   . Alzheimer's disease Brother     No Known Allergies  Current Outpatient Medications on File Prior to Visit  Medication Sig Dispense Refill  . Acetaminophen (TYLENOL ARTHRITIS PAIN PO) Take by mouth.    . Cholecalciferol (VITAMIN D3) 50 MCG (2000 UT) TABS TAKE ONE TABLET BY MOUTH AT BEDTIME 90 tablet 1   No current facility-administered medications on file prior to visit.     BP 124/76   Pulse 82   Temp 98.1 F (36.7 C) (Oral)   Ht 5\' 4"  (1.626 m)   Wt 173 lb (78.5 kg)   SpO2 97%   BMI 29.70 kg/m    Objective:   Physical Exam  Constitutional: She appears well-nourished.  Neck: Neck supple.  Cardiovascular: Normal  rate and regular rhythm.  Respiratory: Effort normal and breath sounds normal.  Skin: Skin is warm and dry.  Psychiatric: She has a normal mood and affect.           Assessment & Plan:

## 2019-04-26 NOTE — Assessment & Plan Note (Signed)
TSH from July never resulted. Repeat TSH pending.  Discussed to wait 30 minutes after taking levothyroxine prior to taking other medications. She takes Vitamin D HS.

## 2019-04-26 NOTE — Assessment & Plan Note (Signed)
A1C of 6.7 in July 2020 which is well controlled. Continue off of medications.   Urine microalbumin pending. Managed on statin. Pneumonia vaccination UTD.  Follow up in 6 months.

## 2019-04-26 NOTE — Patient Instructions (Signed)
Stop by the lab prior to leaving today. I will notify you of your results once received.   Be sure to take your levothyroxine (thyroid medication) every morning on an empty stomach with water only. No food or other medications for 30 minutes. No heartburn medication, iron pills, calcium, vitamin D, or magnesium pills within four hours of taking levothyroxine.   Please schedule a follow up appointment in 6 months.   It was a pleasure to see you today!

## 2019-04-26 NOTE — Assessment & Plan Note (Signed)
Lipids from July 2020 at goal. Continue pravastatin.

## 2019-05-26 ENCOUNTER — Telehealth: Payer: Self-pay | Admitting: Primary Care

## 2019-05-26 NOTE — Telephone Encounter (Signed)
Completed and placed on [place] desk. 

## 2019-05-26 NOTE — Telephone Encounter (Signed)
Left message asking daughter PEggy to call office  Please let her know I receved fmla paperwork and will complete give to kate to sign and I will fax back and let her know when this has been done

## 2019-05-26 NOTE — Telephone Encounter (Signed)
fmla paperwork in Diane Ali's in box for review and signature

## 2019-08-16 ENCOUNTER — Ambulatory Visit: Payer: Medicare Other | Admitting: Primary Care

## 2019-08-30 ENCOUNTER — Ambulatory Visit: Payer: Medicare Other | Admitting: Primary Care

## 2019-08-31 ENCOUNTER — Ambulatory Visit: Payer: Medicare Other | Admitting: Primary Care

## 2019-09-01 ENCOUNTER — Telehealth: Payer: Self-pay | Admitting: Primary Care

## 2019-09-01 NOTE — Telephone Encounter (Signed)
Patient's daughter called.   Patient has an appointment on the 27th. They wanted to know if they can do this virtually.  They also wanted to make sure you have received the paperwork from [facility] for the powered wheel chair

## 2019-09-01 NOTE — Telephone Encounter (Signed)
Yes, okay to complete the visit virtually. Yes, i've received the paperwork. Thanks!

## 2019-09-02 NOTE — Telephone Encounter (Signed)
Message left for Peggy to return my call.

## 2019-09-03 NOTE — Telephone Encounter (Signed)
Spoken to [employer] and notified of comments

## 2019-09-08 ENCOUNTER — Ambulatory Visit (INDEPENDENT_AMBULATORY_CARE_PROVIDER_SITE_OTHER): Payer: Medicare Other | Admitting: Primary Care

## 2019-09-08 ENCOUNTER — Other Ambulatory Visit: Payer: Self-pay

## 2019-09-08 DIAGNOSIS — G8929 Other chronic pain: Secondary | ICD-10-CM

## 2019-09-08 DIAGNOSIS — M25562 Pain in left knee: Secondary | ICD-10-CM | POA: Diagnosis not present

## 2019-09-08 DIAGNOSIS — M8949 Other hypertrophic osteoarthropathy, multiple sites: Secondary | ICD-10-CM

## 2019-09-08 DIAGNOSIS — R269 Unspecified abnormalities of gait and mobility: Secondary | ICD-10-CM | POA: Insufficient documentation

## 2019-09-08 DIAGNOSIS — M159 Polyosteoarthritis, unspecified: Secondary | ICD-10-CM

## 2019-09-08 NOTE — Patient Instructions (Signed)
We will be in touch once we fax off paperwork.  It was a pleasure to see you today! [name], NP-C

## 2019-09-08 NOTE — Telephone Encounter (Signed)
Printed progress notes with paperwork. Faxed as requested.

## 2019-09-08 NOTE — Assessment & Plan Note (Signed)
I believe that she is a good candidate for the electric wheelchair given reasons mentioned above. I am confident that she could operate a simple electric wheelchair and that this would improve her quality of life.  Will complete paperwork and send off for approval.

## 2019-09-08 NOTE — Progress Notes (Addendum)
Subjective:    Patient ID: Diane Ali, female    DOB: Jul 15, 1934, 86 y.o.   MRN: [id number]  HPI  Virtual Visit via Video Note  I connected with Diane Ali on 09/08/19 at 11:00 AM EST by a video enabled telemedicine application and verified that I am speaking with the correct person using two identifiers.  Location: Patient: Home Provider: Office   I discussed the limitations of evaluation and management by telemedicine and the availability of in person appointments. The patient expressed understanding and agreed to proceed.  History of Present Illness:  [name] is a 86 year old female with a history of type 2 diabetes, hypothyroidism, dementia with behavorial disturbance, scoliosis, osteoarthritis, hyperlipidemia, chronic knee pain who presents today with her daughter to discuss [employer] electric wheelchair.   Family and patient are requesting an electric wheelchair given multiple falls with her walker. She has fallen three times within the last 2-3 months, no major injury. They've had to call paramedics once as they could not get her off the floor. They suspect she's falling due to imbalance from chronic left knee pain, arthritis, and scoliosis.   Her family is afraid of her using a scooter due to imbalance, poor posture, and tripping risk. They don't feel that she'll be able to step up onto the scooter without tripping. She is currently using her walker but continues to have imbalance given chronic knee pain and scoliosis. They have tried a non motorized wheelchair but she does not have the strength to propel herself using her arms or feet for more than 5 feet.   She's undergone physical therapy with the last visit being several months ago but the exercises cause increased pain. She would benefit from an electric wheelchair as it is necessary for her utilize getting to the bathroom, kitchen, bedroom, and will assist her with other ADL's. Medical conditions that impact her  mobility needs include osteoarthritis, poor posture, altered gait, chronic knee pain, history of falls.   Her electric wheelchair will be used in her home and in the community. Her family would like to take her outdoors but she cannot ambulate far with her walker. She is willing and motivated to operate the electric wheelchair and we all agree that she is capable of operating a simple motorized wheelchair. Joint ache pain will range from 3-8/10 depending on the activity. She can transfer from bed/toilet walker fine now. Family believes she will be fine with transfers to and from wheelchair.  Weight during her last visit was 173 lbs, height of 5"4", oxygen saturation of 97% on room air. She and her daughter deny pressure sores, lower extremity edema. They are confident she can shift her weight.    Observations/Objective:  Shuffling gait noted on video with walker. 2/5 strength to bilateral upper extremities (daugher assisted with this part of the exam), I observed technique.  Upper extremity ROM is normal. Transfers from bed to walker fine.  Assessment and Plan:  I believe that she is a good candidate for the electric wheelchair given reasons mentioned above. I am confident that she could operate a simple electric wheelchair and that this would improve her quality of life.  Will complete paperwork and send off for approval.   Follow Up Instructions:  We will be in touch once we fax off paperwork.  It was a pleasure to see you today! Diane Bossier, NP-C    I discussed the assessment and treatment plan with the patient. The patient was  provided an opportunity to ask questions and all were answered. The patient agreed with the plan and demonstrated an understanding of the instructions.   The patient was advised to call back or seek an in-person evaluation if the symptoms worsen or if the condition fails to improve as anticipated.    Pleas Koch, NP    Review of Systems    Respiratory: Negative for shortness of breath.   Cardiovascular: Negative for chest pain.  Musculoskeletal: Positive for arthralgias and gait problem.       Recurrent falls  Skin: Negative for wound.  Neurological: Positive for weakness. Negative for dizziness.  Psychiatric/Behavioral:       No acute confusion per daughter       Past Medical History:  Diagnosis Date  . Cataracts, bilateral   . Hyperlipidemia   . Melanoma ([place])    left nares  . Type 2 diabetes mellitus ([place])      Social History   Socioeconomic History  . Marital status: Widowed    Spouse name: Not on file  . Number of children: Not on file  . Years of education: Not on file  . Highest education level: Not on file  Occupational History  . Not on file  Tobacco Use  . Smoking status: Never Smoker  . Smokeless tobacco: Never Used  Substance and Sexual Activity  . Alcohol use: No    Alcohol/week: 0.0 standard drinks  . Drug use: Not on file  . Sexual activity: Never  Other Topics Concern  . Not on file  Social History Narrative   Widow   Retired. Once worked in [employer].   Enjoys reading, watching TV, being with her family.   Lives alone.   Social Determinants of Health   Financial Resource Strain:   . Difficulty of Paying Living Expenses: Not on file  Food Insecurity:   . Worried About Charity fundraiser in the Last Year: Not on file  . Ran Out of Food in the Last Year: Not on file  Transportation Needs:   . Lack of Transportation (Medical): Not on file  . Lack of Transportation (Non-Medical): Not on file  Physical Activity:   . Days of Exercise per Week: Not on file  . Minutes of Exercise per Session: Not on file  Stress:   . Feeling of Stress : Not on file  Social Connections:   . Frequency of Communication with Friends and Family: Not on file  . Frequency of Social Gatherings with Friends and Family: Not on file  . Attends Religious Services: Not on file  . Active Member of Clubs or  Organizations: Not on file  . Attends Archivist Meetings: Not on file  . Marital Status: Not on file  Intimate Partner Violence:   . Fear of Current or Ex-Partner: Not on file  . Emotionally Abused: Not on file  . Physically Abused: Not on file  . Sexually Abused: Not on file    Past Surgical History:  Procedure Laterality Date  . CATARACT EXTRACTION  2016    Family History  Problem Relation Age of Onset  . Diabetes Father   . Alzheimer's disease Brother     No Known Allergies  Current Outpatient Medications on File Prior to Visit  Medication Sig Dispense Refill  . Acetaminophen (TYLENOL ARTHRITIS PAIN PO) Take by mouth.    . Cholecalciferol (VITAMIN D3) 50 MCG (2000 UT) TABS TAKE ONE TABLET BY MOUTH AT BEDTIME 90 tablet 1  .  donepezil (ARICEPT) 5 MG tablet TAKE ONE TABLET BY MOUTH AT BEDTIME FOR MEMORY 90 tablet 3  . escitalopram (LEXAPRO) 10 MG tablet Take 1 tablet (10 mg total) by mouth daily. For anxiety. 90 tablet 3  . levothyroxine (SYNTHROID) 25 MCG tablet TAKE ONE TABLET BY MOUTH IN THE MORNING ON AN EMPTY STOMACH WITH WATER, NO OTHER MEDS OR FOOD FOR 30 MINS 90 tablet 3  . pravastatin (PRAVACHOL) 40 MG tablet TAKE ONE TABLET BY MOUTH IN THE EVENING FOR CHOLESTEROL 90 tablet 3   No current facility-administered medications on file prior to visit.    There were no vitals taken for this visit.   Objective:   Physical Exam  Constitutional: She is oriented to person, place, and time. She appears well-nourished.  Respiratory: Effort normal.  Musculoskeletal:     Comments: Ambulates with shuffling gait with walker during video presentation. 2/5 strength to bilateral upper extremities, 3/5 strength to lower extremities (daugher assisted with this part of the exam), I observed technique.  Upper extremity ROM is normal.   Neurological: She is alert and oriented to person, place, and time.  Psychiatric: She has a normal mood and affect.             Assessment & Plan:

## 2019-09-08 NOTE — Telephone Encounter (Signed)
Completed paperwork after visit. Placed in [place].

## 2019-09-15 NOTE — Telephone Encounter (Signed)
Diane Ali from [facility] called asking if we had received a request for more information. In particular, her lower extremity strength. Michela Pitcher it was faxed to [place] on 09-08-19. They received the initial paperwork sent from [place]. Please advise at [phone] Ref # [id number]

## 2019-09-17 NOTE — Telephone Encounter (Signed)
Faxed updated progressed noted as requested.

## 2019-09-20 ENCOUNTER — Telehealth: Payer: Self-pay | Admitting: *Deleted

## 2019-09-20 NOTE — Telephone Encounter (Signed)
Diane Ali with [facility] [employer] left a voicemail wanting to confirm that [name] NP got the paperwork to complete for the power chair for the patient. [name] stated that the paperwork needs to be completed, NPI number on it, signed and faxed back to them. Diane Ali requested a call back to confirm that the paperwork was received and it is being worked on.. Reference [id number]

## 2019-09-20 NOTE — Telephone Encounter (Signed)
This was completed earlier today and placed in Chan's inbox. We have been seeing patient's all afternoon so I'm not sure if she had a chance to fax off.

## 2019-09-20 NOTE — Telephone Encounter (Signed)
This has been faxed earlier this afternoon and it showed, it went through.

## 2019-10-05 ENCOUNTER — Telehealth: Payer: Self-pay | Admitting: Primary Care

## 2019-10-05 NOTE — Telephone Encounter (Signed)
Patient's Daughter Called Today. She stated that at the patient's retirement community they are giving out the covid vaccine tomorrow to them . They wanted to know if you recommend the patient getting the vaccine  Diane Ali,patient's daughter would like a call back. She stated if she does not answer to please leave a message with yes or no.  Diane Ali stated she needed to fill out the patient's paperwork tonight to take with them tomorrow for the vaccine.

## 2019-10-06 NOTE — Telephone Encounter (Signed)
Yes, I approve of her getting the Covid vaccine.

## 2019-10-06 NOTE — Telephone Encounter (Signed)
Spoken and notified patient's daughter of Kate Clark's comments. Patient's daughter verbalized understanding.  

## 2019-10-09 DIAGNOSIS — M6281 Muscle weakness (generalized): Secondary | ICD-10-CM | POA: Diagnosis not present

## 2019-10-09 DIAGNOSIS — M25562 Pain in left knee: Secondary | ICD-10-CM | POA: Diagnosis not present

## 2019-10-09 DIAGNOSIS — M1990 Unspecified osteoarthritis, unspecified site: Secondary | ICD-10-CM | POA: Diagnosis not present

## 2019-11-06 DIAGNOSIS — M6281 Muscle weakness (generalized): Secondary | ICD-10-CM | POA: Diagnosis not present

## 2019-11-06 DIAGNOSIS — M1990 Unspecified osteoarthritis, unspecified site: Secondary | ICD-10-CM | POA: Diagnosis not present

## 2019-11-06 DIAGNOSIS — M25562 Pain in left knee: Secondary | ICD-10-CM | POA: Diagnosis not present

## 2019-12-02 ENCOUNTER — Telehealth: Payer: Self-pay | Admitting: Primary Care

## 2019-12-02 NOTE — Telephone Encounter (Signed)
Pt is checking to see if Diane Ali received paperwork from [employer]? She said it is an extension of her FMLA. Please give her a call back.

## 2019-12-03 NOTE — Telephone Encounter (Signed)
I'm not sure. There is no my chart message regarding this. Diane Ali, can you find out what's going on?

## 2019-12-03 NOTE — Telephone Encounter (Signed)
I believe there is a MyChart message regarding this. I am not sure who is working on this since Shirlean Mylar is not here.

## 2019-12-03 NOTE — Telephone Encounter (Signed)
Diane Ali, can you take a look?

## 2019-12-03 NOTE — Telephone Encounter (Signed)
I went through all of the paperwork. We have not received anything.  I sent the patient a message letting her know we have not received anything and to have it faxed again.

## 2019-12-03 NOTE — Telephone Encounter (Signed)
Noted  

## 2019-12-07 DIAGNOSIS — M1990 Unspecified osteoarthritis, unspecified site: Secondary | ICD-10-CM | POA: Diagnosis not present

## 2019-12-07 DIAGNOSIS — M25562 Pain in left knee: Secondary | ICD-10-CM | POA: Diagnosis not present

## 2019-12-07 DIAGNOSIS — M6281 Muscle weakness (generalized): Secondary | ICD-10-CM | POA: Diagnosis not present

## 2019-12-08 NOTE — Telephone Encounter (Signed)
FMLA paperwork received and placed in provider basket.

## 2019-12-08 NOTE — Telephone Encounter (Signed)
Completed and placed on Shavaughn's desk.

## 2019-12-08 NOTE — Telephone Encounter (Signed)
Shavaughn, can you take a look?

## 2019-12-13 NOTE — Telephone Encounter (Signed)
Patient's daughter aware paperwork has been completed and is up front for pick up.

## 2020-01-06 DIAGNOSIS — M1990 Unspecified osteoarthritis, unspecified site: Secondary | ICD-10-CM | POA: Diagnosis not present

## 2020-01-06 DIAGNOSIS — M6281 Muscle weakness (generalized): Secondary | ICD-10-CM | POA: Diagnosis not present

## 2020-01-06 DIAGNOSIS — M25562 Pain in left knee: Secondary | ICD-10-CM | POA: Diagnosis not present

## 2020-01-27 ENCOUNTER — Other Ambulatory Visit: Payer: Self-pay | Admitting: Primary Care

## 2020-01-27 DIAGNOSIS — E785 Hyperlipidemia, unspecified: Secondary | ICD-10-CM

## 2020-02-06 DIAGNOSIS — M25562 Pain in left knee: Secondary | ICD-10-CM | POA: Diagnosis not present

## 2020-02-06 DIAGNOSIS — M1990 Unspecified osteoarthritis, unspecified site: Secondary | ICD-10-CM | POA: Diagnosis not present

## 2020-02-06 DIAGNOSIS — M6281 Muscle weakness (generalized): Secondary | ICD-10-CM | POA: Diagnosis not present

## 2020-03-07 DIAGNOSIS — M25562 Pain in left knee: Secondary | ICD-10-CM | POA: Diagnosis not present

## 2020-03-07 DIAGNOSIS — M1990 Unspecified osteoarthritis, unspecified site: Secondary | ICD-10-CM | POA: Diagnosis not present

## 2020-03-07 DIAGNOSIS — M6281 Muscle weakness (generalized): Secondary | ICD-10-CM | POA: Diagnosis not present

## 2020-04-07 DIAGNOSIS — M25562 Pain in left knee: Secondary | ICD-10-CM | POA: Diagnosis not present

## 2020-04-07 DIAGNOSIS — M1990 Unspecified osteoarthritis, unspecified site: Secondary | ICD-10-CM | POA: Diagnosis not present

## 2020-04-07 DIAGNOSIS — M6281 Muscle weakness (generalized): Secondary | ICD-10-CM | POA: Diagnosis not present

## 2020-04-16 ENCOUNTER — Other Ambulatory Visit: Payer: Self-pay | Admitting: Primary Care

## 2020-04-16 DIAGNOSIS — F0391 Unspecified dementia with behavioral disturbance: Secondary | ICD-10-CM

## 2020-04-16 DIAGNOSIS — E039 Hypothyroidism, unspecified: Secondary | ICD-10-CM

## 2020-05-23 ENCOUNTER — Other Ambulatory Visit: Payer: Self-pay | Admitting: Primary Care

## 2020-05-23 DIAGNOSIS — E785 Hyperlipidemia, unspecified: Secondary | ICD-10-CM

## 2020-05-23 DIAGNOSIS — F0391 Unspecified dementia with behavioral disturbance: Secondary | ICD-10-CM

## 2020-06-05 ENCOUNTER — Other Ambulatory Visit: Payer: Self-pay

## 2020-06-05 ENCOUNTER — Encounter: Payer: Self-pay | Admitting: Primary Care

## 2020-06-05 ENCOUNTER — Ambulatory Visit (INDEPENDENT_AMBULATORY_CARE_PROVIDER_SITE_OTHER): Payer: Medicare Other | Admitting: Primary Care

## 2020-06-05 VITALS — BP 130/72 | HR 62 | Temp 97.0°F | Wt 162.5 lb

## 2020-06-05 DIAGNOSIS — R269 Unspecified abnormalities of gait and mobility: Secondary | ICD-10-CM

## 2020-06-05 DIAGNOSIS — E039 Hypothyroidism, unspecified: Secondary | ICD-10-CM

## 2020-06-05 DIAGNOSIS — E785 Hyperlipidemia, unspecified: Secondary | ICD-10-CM | POA: Diagnosis not present

## 2020-06-05 DIAGNOSIS — F0391 Unspecified dementia with behavioral disturbance: Secondary | ICD-10-CM

## 2020-06-05 DIAGNOSIS — Z23 Encounter for immunization: Secondary | ICD-10-CM

## 2020-06-05 DIAGNOSIS — E119 Type 2 diabetes mellitus without complications: Secondary | ICD-10-CM | POA: Diagnosis not present

## 2020-06-05 LAB — CBC
HCT: 35 % — ABNORMAL LOW (ref 36.0–46.0)
Hemoglobin: 11.9 g/dL — ABNORMAL LOW (ref 12.0–15.0)
MCHC: 34 g/dL (ref 30.0–36.0)
MCV: 92.6 fl (ref 78.0–100.0)
Platelets: 166 10*3/uL (ref 150.0–400.0)
RBC: 3.78 Mil/uL — ABNORMAL LOW (ref 3.87–5.11)
RDW: 13 % (ref 11.5–15.5)
WBC: 4.6 10*3/uL (ref 4.0–10.5)

## 2020-06-05 LAB — COMPREHENSIVE METABOLIC PANEL
ALT: 13 U/L (ref 0–35)
AST: 20 U/L (ref 0–37)
Albumin: 4.1 g/dL (ref 3.5–5.2)
Alkaline Phosphatase: 53 U/L (ref 39–117)
BUN: 21 mg/dL (ref 6–23)
CO2: 29 mEq/L (ref 19–32)
Calcium: 9.5 mg/dL (ref 8.4–10.5)
Chloride: 105 mEq/L (ref 96–112)
Creatinine, Ser: 1.21 mg/dL — ABNORMAL HIGH (ref 0.40–1.20)
GFR: 41.32 mL/min — ABNORMAL LOW (ref >60.00)
Glucose, Bld: 115 mg/dL — ABNORMAL HIGH (ref 70–99)
Potassium: 4.9 mEq/L (ref 3.5–5.1)
Sodium: 140 mEq/L (ref 135–145)
Total Bilirubin: 0.6 mg/dL (ref 0.2–1.2)
Total Protein: 6.2 g/dL (ref 6.0–8.3)

## 2020-06-05 LAB — TSH: TSH: 2.99 u[IU]/mL (ref 0.35–4.50)

## 2020-06-05 LAB — LIPID PANEL
Cholesterol: 157 mg/dL (ref 0–200)
HDL: 42.2 mg/dL (ref >39.00)
LDL Cholesterol: 91 mg/dL (ref 0–99)
NonHDL: 115.26
Total CHOL/HDL Ratio: 4
Triglycerides: 119 mg/dL (ref 0.0–149.0)
VLDL: 23.8 mg/dL (ref 0.0–40.0)

## 2020-06-05 LAB — HEMOGLOBIN A1C: Hgb A1c MFr Bld: 6.6 % — ABNORMAL HIGH (ref 4.6–6.5)

## 2020-06-05 NOTE — Assessment & Plan Note (Signed)
She appears to be taking levothyroxine correctly. Repeat TSH pending. Continue levothyroxine 25 mcg.

## 2020-06-05 NOTE — Progress Notes (Signed)
Subjective:    Patient ID: Diane Ali, female    DOB: 1933-11-25, 86 y.o.   MRN: [id number]  HPI  This visit occurred during the SARS-CoV-2 public health emergency.  Safety protocols were in place, including screening questions prior to the visit, additional usage of staff PPE, and extensive cleaning of exam room while observing appropriate contact time as indicated for disinfecting solutions.   Diane Ali is a 86 year old female with a history of type 2 diabetes, hypothyroidism, dementia with behavorial disturbance, hyperlipidemia, chronic knee pain, osteoarthritis who presents today for follow up.  1) Type 2 Diabetes:  Current medications include: None  Last A1C: 6.7 in July 2020 Last Eye Exam: UTD Last Foot Exam: UTD Pneumonia Vaccination: UTD ACE/ARB: None. Urine microalbumin UTD. Statin: Pravastatin   2) Dementia: Currently managed on Aricept 5 mg. She is residing at the [facility] in [place]. She is really enjoying residing there, is active with activities. Overall memory is about the same, no worse and no better per family.  Her family is checking on her daily and prepares her pill boxes.   3) Hypothyroidism: Currently managed on levothyroxine 25 mcg. She is taking her levothyroxine every morning on an empty stomach with water only. She doesn't eat or take other medications for 30 minutes. She is due for repeat TSH today.  BP Readings from Last 3 Encounters:  06/05/20 130/72  04/26/19 124/76  05/13/18 122/68     Review of Systems  Respiratory: Negative for shortness of breath.   Cardiovascular: Negative for chest pain.  Neurological: Negative for dizziness and headaches.       Past Medical History:  Diagnosis Date  . Cataracts, bilateral   . Hyperlipidemia   . Melanoma ([place])    left nares  . Type 2 diabetes mellitus ([place])      Social History   Socioeconomic History  . Marital status: Widowed    Spouse name: Not on file  .  Number of children: Not on file  . Years of education: Not on file  . Highest education level: Not on file  Occupational History  . Not on file  Tobacco Use  . Smoking status: Never Smoker  . Smokeless tobacco: Never Used  Substance and Sexual Activity  . Alcohol use: No    Alcohol/week: 0.0 standard drinks  . Drug use: Not on file  . Sexual activity: Never  Other Topics Concern  . Not on file  Social History Narrative   Widow   Retired. Once worked in [employer].   Enjoys reading, watching TV, being with her family.   Lives alone.   Social Determinants of Health   Financial Resource Strain:   . Difficulty of Paying Living Expenses: Not on file  Food Insecurity:   . Worried About Charity fundraiser in the Last Year: Not on file  . Ran Out of Food in the Last Year: Not on file  Transportation Needs:   . Lack of Transportation (Medical): Not on file  . Lack of Transportation (Non-Medical): Not on file  Physical Activity:   . Days of Exercise per Week: Not on file  . Minutes of Exercise per Session: Not on file  Stress:   . Feeling of Stress : Not on file  Social Connections:   . Frequency of Communication with Friends and Family: Not on file  . Frequency of Social Gatherings with Friends and Family: Not on file  . Attends Religious  Services: Not on file  . Active Member of Clubs or Organizations: Not on file  . Attends Archivist Meetings: Not on file  . Marital Status: Not on file  Intimate Partner Violence:   . Fear of Current or Ex-Partner: Not on file  . Emotionally Abused: Not on file  . Physically Abused: Not on file  . Sexually Abused: Not on file    Past Surgical History:  Procedure Laterality Date  . CATARACT EXTRACTION  2016    Family History  Problem Relation Age of Onset  . Diabetes Father   . Alzheimer's disease Brother     No Known Allergies  Current Outpatient Medications on File Prior to Visit  Medication Sig Dispense Refill  .  Acetaminophen (TYLENOL ARTHRITIS PAIN PO) Take by mouth.    . Cholecalciferol (VITAMIN D3) 50 MCG (2000 UT) TABS TAKE ONE TABLET BY MOUTH AT BEDTIME 90 tablet 1  . donepezil (ARICEPT) 5 MG tablet TAKE ONE TABLET BY MOUTH AT BEDTIME FOR MEMORY 90 tablet 0  . escitalopram (LEXAPRO) 10 MG tablet TAKE 1 TABLET (10 MG TOTAL) BY MOUTH DAILY. FOR ANXIETY. 90 tablet 0  . levothyroxine (SYNTHROID) 25 MCG tablet TAKE ONE TABLET BY MOUTH IN THE MORNING ON AN EMPTY STOMACH WITH WATER, NO OTHER MEDS OR FOOD FOR 30 MINS 90 tablet 0  . pravastatin (PRAVACHOL) 40 MG tablet TAKE ONE TABLET BY MOUTH IN THE EVENING FOR CHOLESTEROL 90 tablet 0   No current facility-administered medications on file prior to visit.    BP 130/72 (BP Location: Right Arm, Patient Position: Sitting, Cuff Size: Normal)   Pulse 62   Temp (!) 97 F (36.1 C) (Temporal)   Wt 162 lb 8 oz (73.7 kg)   SpO2 96%   BMI 27.89 kg/m    Objective:   Physical Exam Cardiovascular:     Rate and Rhythm: Normal rate and regular rhythm.  Pulmonary:     Effort: Pulmonary effort is normal.     Breath sounds: Normal breath sounds.  Musculoskeletal:     Cervical back: Neck supple.  Skin:    General: Skin is warm and dry.            Assessment & Plan:

## 2020-06-05 NOTE — Assessment & Plan Note (Signed)
Doing well with rolator walker.

## 2020-06-05 NOTE — Assessment & Plan Note (Signed)
Stable per family, no worse.  Continue to monitor.  Alert and oriented today.

## 2020-06-05 NOTE — Assessment & Plan Note (Signed)
Repeat A1C pending. Continue off mediations for now.   Managed on statin. Urine micro UTD. Pneumonia vaccination UTD. Eye exam UTD.  Follow up in 6 months.

## 2020-06-05 NOTE — Patient Instructions (Signed)
Stop by the lab prior to leaving today. I will notify you of your results once received.   It was a pleasure to see you today!  

## 2020-06-05 NOTE — Assessment & Plan Note (Signed)
Compliant to pravastatin. Repeat lipid panel pending.

## 2020-06-08 ENCOUNTER — Telehealth: Payer: Self-pay

## 2020-06-08 NOTE — Telephone Encounter (Signed)
Think you have this ppw :-)

## 2020-06-08 NOTE — Telephone Encounter (Signed)
Patient daughter, dropped off FMLA paperwork to be filled out. Daughter needs time off to care for her mother. Please call daughter once forms are complete.

## 2020-06-09 NOTE — Telephone Encounter (Signed)
Called daughter in regards to paperwork. LVM to call back. See [place] Cordts.

## 2020-06-09 NOTE — Telephone Encounter (Signed)
Patient's daughter called back. Forms placed in Kate's inbox for review and signature.

## 2020-06-09 NOTE — Telephone Encounter (Signed)
Anda Kraft will address upon her return

## 2020-06-12 NOTE — Telephone Encounter (Signed)
Completed and handed to [place].

## 2020-06-20 NOTE — Telephone Encounter (Signed)
Robin, can you take a look? 

## 2020-06-20 NOTE — Telephone Encounter (Signed)
Do you have paperwork? 

## 2020-06-20 NOTE — Telephone Encounter (Signed)
Think I gave this to you. Should I have faxed before I gave it back?

## 2020-06-20 NOTE — Telephone Encounter (Signed)
Darolyn Rua (daughter) called checking on papework  Do you have?? Best number [phone]

## 2020-06-20 NOTE — Telephone Encounter (Signed)
I don't have paperwork Danton Clap do you have paperwork

## 2020-06-21 NOTE — Telephone Encounter (Signed)
Called patient's daughter. LVM to call back.  When calls back please notify FMLA paperwork is done.  Copy for scan Copy for patient Copy for self.

## 2020-06-21 NOTE — Telephone Encounter (Signed)
Daughter aware.

## 2020-07-16 ENCOUNTER — Other Ambulatory Visit: Payer: Self-pay | Admitting: Primary Care

## 2020-07-16 DIAGNOSIS — F0391 Unspecified dementia with behavioral disturbance: Secondary | ICD-10-CM

## 2020-07-16 DIAGNOSIS — E039 Hypothyroidism, unspecified: Secondary | ICD-10-CM

## 2020-09-06 ENCOUNTER — Ambulatory Visit: Payer: Medicare Other | Admitting: Primary Care

## 2020-09-08 ENCOUNTER — Telehealth (INDEPENDENT_AMBULATORY_CARE_PROVIDER_SITE_OTHER): Payer: Medicare Other | Admitting: Primary Care

## 2020-09-08 ENCOUNTER — Other Ambulatory Visit: Payer: Self-pay

## 2020-09-08 DIAGNOSIS — F0391 Unspecified dementia with behavioral disturbance: Secondary | ICD-10-CM

## 2020-09-08 NOTE — Assessment & Plan Note (Signed)
Deteriorated and is progressing.  Agree that memory care is the best option for this patient given her history of dementia and these ongoing symptoms.  I recommended they look into local facilities and inquire with insurance regarding coverage and neck steps.  Once they have chosen a facility I will fill out an [place] form.  They will update.

## 2020-09-08 NOTE — Patient Instructions (Signed)
Please update me when you have chosen a facility.  Please inquire with the insurance company regarding this process.  It was a pleasure to see you today! [name], NP-C

## 2020-09-08 NOTE — Progress Notes (Signed)
Subjective:    Patient ID: Diane Ali, female    DOB: 1934/02/08, 87 y.o.   MRN: [id number]  HPI  This visit occurred during the SARS-CoV-2 public health emergency.  Safety protocols were in place, including screening questions prior to the visit, additional usage of staff PPE, and extensive cleaning of exam room while observing appropriate contact time as indicated for disinfecting solutions.   Virtual Visit via Video Note  I connected with Diane Ali on 09/08/20 at 12:00 PM EST by a video enabled telemedicine application and verified that I am speaking with the correct person using two identifiers.  Location: Patient: Home Provider: Office Participants: Patient, her daughter, myself.   I discussed the limitations of evaluation and management by telemedicine and the availability of in person appointments. The patient expressed understanding and agreed to proceed.  History of Present Illness:  Diane Ali is a 87 year old female with a history of type 2 diabetes, hypothyroidism, dementia with behavioral disturbance, hyperlipidemia, osteoarthritis who presents today with her daughter to discuss a higher level of care.  The patient is present but is not participating in the visit.  HPI is coming solely from her daughter.  She currently resides in independent living, but has been demonstrating unsafe behavior over the last several months including wandering during the night, walking down hallways halfway and dressed, opening and closing doors throughout the day and evening, increased confusion, attempting to walk down the stairs with her walker, not cleansing herself after using the bathroom, putting depends/washcloths in the toilet, becomes confused when dressing herself, mixes up the hairbrush and remote control, inability to turn off and on the TV, increased irritability, difficulty following instructions.  Her family has requested an apartment on the first floor of the  independent living facility, no first-floor apartment are available.  She cannot take care of herself, her family checks on her several times daily and is helping her bathe, dress, eat.  Her family is looking into memory care so that she can be monitored 24 hours a day.  They have not yet chosen a facility as they have just begun this process.   Observations/Objective:  Alert, not communicating during visit. Appears well, not sickly. No distress. Resting comfortably.   Assessment and Plan:  Agree that memory care is the best option for this patient given her history of dementia and these ongoing symptoms.  I recommended they look into local facilities and inquire with insurance regarding coverage and neck steps.  Once they have chosen a facility I will fill out an FL 2 form.  They will update.  Follow Up Instructions:  Please update me when you have chosen a facility.  Please inquire with the insurance company regarding this process.  It was a pleasure to see you today! Diane Bossier, NP-C    I discussed the assessment and treatment plan with the patient. The patient was provided an opportunity to ask questions and all were answered. The patient agreed with the plan and demonstrated an understanding of the instructions.   The patient was advised to call back or seek an in-person evaluation if the symptoms worsen or if the condition fails to improve as anticipated.     Diane Koch, NP     Review of Systems  Musculoskeletal: Positive for arthralgias.  Psychiatric/Behavioral: Positive for agitation, confusion and sleep disturbance.       See HPI       Past Medical History:  Diagnosis Date  .  Cataracts, bilateral   . Hyperlipidemia   . Melanoma ([place])    left nares  . Type 2 diabetes mellitus ([place])      Social History   Socioeconomic History  . Marital status: Widowed    Spouse name: Not on file  . Number of children: Not on file  . Years of education:  Not on file  . Highest education level: Not on file  Occupational History  . Not on file  Tobacco Use  . Smoking status: Never Smoker  . Smokeless tobacco: Never Used  Substance and Sexual Activity  . Alcohol use: No    Alcohol/week: 0.0 standard drinks  . Drug use: Not on file  . Sexual activity: Never  Other Topics Concern  . Not on file  Social History Narrative   Widow   Retired. Once worked in [employer].   Enjoys reading, watching TV, being with her family.   Lives alone.   Social Determinants of Health   Financial Resource Strain: Not on file  Food Insecurity: Not on file  Transportation Needs: Not on file  Physical Activity: Not on file  Stress: Not on file  Social Connections: Not on file  Intimate Partner Violence: Not on file    Past Surgical History:  Procedure Laterality Date  . CATARACT EXTRACTION  2016    Family History  Problem Relation Age of Onset  . Diabetes Father   . Alzheimer's disease Brother     No Known Allergies  Current Outpatient Medications on File Prior to Visit  Medication Sig Dispense Refill  . Acetaminophen (TYLENOL ARTHRITIS PAIN PO) Take by mouth.    . Cholecalciferol (VITAMIN D3) 50 MCG (2000 UT) TABS TAKE ONE TABLET BY MOUTH AT BEDTIME 90 tablet 1  . donepezil (ARICEPT) 5 MG tablet TAKE ONE TABLET BY MOUTH AT BEDTIME FOR MEMORY 90 tablet 0  . escitalopram (LEXAPRO) 10 MG tablet TAKE 1 TABLET (10 MG TOTAL) BY MOUTH DAILY. FOR ANXIETY. 90 tablet 3  . levothyroxine (SYNTHROID) 25 MCG tablet TAKE ONE TABLET BY MOUTH IN THE MORNING ON AN EMPTY STOMACH WITH WATER, NO OTHER MEDS OR FOOD FOR 30 MINS 90 tablet 3  . pravastatin (PRAVACHOL) 40 MG tablet TAKE ONE TABLET BY MOUTH IN THE EVENING FOR CHOLESTEROL 90 tablet 0   No current facility-administered medications on file prior to visit.    There were no vitals taken for this visit.   Objective:   Physical Exam Constitutional:      General: She is not in acute distress.     Appearance: She is not ill-appearing.  Pulmonary:     Effort: Pulmonary effort is normal.  Neurological:     Mental Status: She is alert.     Comments: Not participating during HPI  Psychiatric:        Behavior: Behavior normal.            Assessment & Plan:

## 2020-09-23 ENCOUNTER — Other Ambulatory Visit: Payer: Self-pay | Admitting: Primary Care

## 2020-09-23 DIAGNOSIS — F0391 Unspecified dementia with behavioral disturbance: Secondary | ICD-10-CM

## 2020-09-25 NOTE — Telephone Encounter (Signed)
Last virtual was 09/08/2020 ok to refill?

## 2020-09-25 NOTE — Telephone Encounter (Signed)
Based off of our last visit it does not seem that this medication is working any longer for her memory. I would call one of her daughters to see if they would like for her to continue taking given little benefits.  I am happy to refill, but would like their input as well.

## 2020-09-26 NOTE — Telephone Encounter (Signed)
Noted, refills provided. Thank you for the update.

## 2020-09-26 NOTE — Telephone Encounter (Signed)
Called and l/m for daughter peggy to call office.

## 2020-09-26 NOTE — Telephone Encounter (Signed)
Called and spoke to [place] if it is not hurting her she would like for her to continue taking. For any benefits it is providing. She also wanted to let you know they are still working on finding her a facility but have not yet.

## 2020-09-26 NOTE — Telephone Encounter (Signed)
returning phone call

## 2020-10-25 ENCOUNTER — Telehealth: Payer: Self-pay | Admitting: Primary Care

## 2020-10-25 NOTE — Telephone Encounter (Signed)
Do you have a FL2 form?

## 2020-10-25 NOTE — Telephone Encounter (Signed)
Diane Ali called in and she stated that Anda Kraft was waiting on where to send the [facility] form it can be sent to [facility] [address] And it can be faxed [phone] attn: [email or web address] , they will not let her see it until they have and they are trying to tour on Saturday.

## 2020-10-25 NOTE — Telephone Encounter (Signed)
Please advise 

## 2020-10-25 NOTE — Telephone Encounter (Signed)
Placed in your box 

## 2020-10-26 NOTE — Telephone Encounter (Signed)
Form faxed and called daughter to let her know.

## 2020-10-26 NOTE — Telephone Encounter (Signed)
Called patient daughter let them know that we do not have number. She will call and get information and reach back out to [place].

## 2020-10-26 NOTE — Telephone Encounter (Signed)
Patient's daughter returned call.  She said [employer] needs a copy of FL-2 sent to them, also. [employer] said we would have the fax number to fax the form to them.

## 2020-10-26 NOTE — Telephone Encounter (Signed)
Completed and placed in Joellen's inbox. 

## 2020-10-27 NOTE — Telephone Encounter (Signed)
Fl2 faxed as requested

## 2020-10-27 NOTE — Telephone Encounter (Signed)
Daughter called and wanted to provide the fax number : [phone]

## 2020-10-27 NOTE — Telephone Encounter (Signed)
Diane Ali, Ann's daughter is calling in the form is needing to go to the retirement home she is at and to [facility], Fax number for [facility] is: [phone] -- Vickii Chafe states her sister will call back with [facility] fax number.Marland Kitchen

## 2020-10-27 NOTE — Telephone Encounter (Signed)
Has been faxed to home. Once call received from family for [facility] number we will send there.

## 2020-11-01 ENCOUNTER — Other Ambulatory Visit: Payer: Self-pay | Admitting: Primary Care

## 2020-11-01 DIAGNOSIS — E785 Hyperlipidemia, unspecified: Secondary | ICD-10-CM

## 2020-11-06 ENCOUNTER — Telehealth: Payer: Self-pay | Admitting: Primary Care

## 2020-11-06 NOTE — Telephone Encounter (Signed)
ANDREA CALLED IN WANTED TO KNOW ABOUT GETTING A STAT REQUEST ON MEDICAL RECORDS AND FAXED IT TO [place].Marland Kitchen [place] NUMBER [phone]

## 2020-11-07 NOTE — Telephone Encounter (Signed)
This has been sent to our records dept to be processed as a stat.

## 2021-01-11 ENCOUNTER — Encounter (HOSPITAL_COMMUNITY): Payer: Self-pay | Admitting: Emergency Medicine

## 2021-01-11 ENCOUNTER — Other Ambulatory Visit: Payer: Self-pay

## 2021-01-11 ENCOUNTER — Emergency Department (HOSPITAL_COMMUNITY): Payer: Medicare Other

## 2021-01-11 ENCOUNTER — Emergency Department (HOSPITAL_COMMUNITY)
Admission: EM | Admit: 2021-01-11 | Discharge: 2021-01-11 | Disposition: A | Discharge status: Home / Self Care | Payer: Medicare Other | Attending: Emergency Medicine | Admitting: Emergency Medicine

## 2021-01-11 DIAGNOSIS — F0391 Unspecified dementia with behavioral disturbance: Secondary | ICD-10-CM | POA: Diagnosis not present

## 2021-01-11 DIAGNOSIS — Z85828 Personal history of other malignant neoplasm of skin: Secondary | ICD-10-CM | POA: Diagnosis not present

## 2021-01-11 DIAGNOSIS — W19XXXA Unspecified fall, initial encounter: Secondary | ICD-10-CM | POA: Diagnosis not present

## 2021-01-11 DIAGNOSIS — R234 Changes in skin texture: Secondary | ICD-10-CM | POA: Insufficient documentation

## 2021-01-11 DIAGNOSIS — E119 Type 2 diabetes mellitus without complications: Secondary | ICD-10-CM | POA: Diagnosis not present

## 2021-01-11 DIAGNOSIS — Z79899 Other long term (current) drug therapy: Secondary | ICD-10-CM | POA: Diagnosis not present

## 2021-01-11 DIAGNOSIS — Z043 Encounter for examination and observation following other accident: Secondary | ICD-10-CM | POA: Diagnosis not present

## 2021-01-11 DIAGNOSIS — E039 Hypothyroidism, unspecified: Secondary | ICD-10-CM | POA: Insufficient documentation

## 2021-01-11 NOTE — ED Triage Notes (Signed)
Pt coming from home with complaints of a fall. Denies LOC, denies hitting head. Pt family unable to care for pt in home anymore  Pt takes aspirin  Pain to back and buttox  Ambulatory on regular basis  Hx dementia  140/80 98%  16 RR  70 HR  97.7 Temp

## 2021-01-11 NOTE — ED Notes (Signed)
Pt walked to restroom with standby assist.

## 2021-01-11 NOTE — ED Notes (Addendum)
Patient has a bed alarm on bed and turned "on"

## 2021-01-11 NOTE — ED Provider Notes (Signed)
[facility] DEPT Provider Note   CSN: [id number] Arrival date & time: 01/11/21  1152     History Chief Complaint  Patient presents with  . Fall    Diane Ali is a 87 y.o. female.  Fall. Multiple falls in the past month, forgets to use her walker.  Family is waiting to get her into a memory care facility.  She has been behaving as her normal self minus the falls.  Patient does not claim that she has any pain currently.  She does not remember the incident as well.        Past Medical History:  Diagnosis Date  . Cataracts, bilateral   . Hyperlipidemia   . Melanoma ([place])    left nares  . Type 2 diabetes mellitus [facility])     Patient Active Problem List   Diagnosis Date Noted  . Altered gait 09/08/2019  . Chronic pain of left knee 04/07/2018  . Osteoarthritis 06/04/2017  . Dementia with behavioral disturbance ([place]) 01/29/2017  . Preventative health care 02/26/2016  . Diabetes type 2, controlled ([place]) 02/21/2015  . Hyperlipidemia LDL goal <100 02/21/2015  . Hypothyroidism 02/21/2015  . Medicare annual wellness visit, subsequent 02/21/2015  . Vitamin D deficiency 02/21/2015    Past Surgical History:  Procedure Laterality Date  . CATARACT EXTRACTION  2016     OB History   No obstetric history on file.     Family History  Problem Relation Age of Onset  . Diabetes Father   . Alzheimer's disease Brother     Social History   Tobacco Use  . Smoking status: Never Smoker  . Smokeless tobacco: Never Used  Substance Use Topics  . Alcohol use: No    Alcohol/week: 0.0 standard drinks    Home Medications Prior to Admission medications   Medication Sig Start Date End Date Taking? Authorizing Provider  Acetaminophen (TYLENOL ARTHRITIS PAIN PO) Take by mouth.    [provider]  Cholecalciferol (VITAMIN D3) 50 MCG (2000 UT) TABS TAKE ONE TABLET BY MOUTH AT BEDTIME 10/16/18   Pleas Koch, NP  donepezil (ARICEPT) 5 MG  tablet TAKE ONE TABLET BY MOUTH AT BEDTIME FOR MEMORY 09/26/20   Pleas Koch, NP  escitalopram (LEXAPRO) 10 MG tablet TAKE 1 TABLET (10 MG TOTAL) BY MOUTH DAILY. FOR ANXIETY. 07/17/20   Pleas Koch, NP  levothyroxine (SYNTHROID) 25 MCG tablet TAKE ONE TABLET BY MOUTH IN THE MORNING ON AN EMPTY STOMACH WITH WATER, NO OTHER MEDS OR FOOD FOR 30 MINS 07/17/20   Pleas Koch, NP  pravastatin (PRAVACHOL) 40 MG tablet TAKE ONE TABLET BY MOUTH IN THE EVENING FOR CHOLESTEROL 11/01/20   Pleas Koch, NP    Allergies    Patient has no known allergies.  Review of Systems   Review of Systems  Unable to perform ROS: Dementia    Physical Exam Updated Vital Signs BP (!) 152/79 (BP Location: Right Arm)   Pulse 69   Temp 98.7 F (37.1 C) (Oral)   Resp 16   SpO2 100%   Physical Exam Vitals and nursing note reviewed. Exam conducted with a chaperone present.  Constitutional:      General: She is not in acute distress.    Appearance: Normal appearance.  HENT:     Head: Normocephalic and atraumatic.     Nose: No rhinorrhea.  Eyes:     General:        Right eye: No discharge.  Left eye: No discharge.     Conjunctiva/sclera: Conjunctivae normal.  Cardiovascular:     Rate and Rhythm: Normal rate and regular rhythm.  Pulmonary:     Effort: Pulmonary effort is normal. No respiratory distress.     Breath sounds: No stridor.  Abdominal:     General: Abdomen is flat. There is no distension.     Palpations: Abdomen is soft.  Musculoskeletal:        General: No tenderness or signs of injury.     Comments: Bilateral eschars at the base of the fifth metatarsal.  No significant surrounding erythema purulent drainage tenderness or induration or fluctuance.  Skin:    General: Skin is warm and dry.  Neurological:     General: No focal deficit present.     Mental Status: She is alert. Mental status is at baseline.     Motor: No weakness.     Comments: Moves all 4 extremities  equally sensation intact.  Cranial nerves intact.  Patient cannot remember the date.  She does not know she is in the hospital.  Psychiatric:        Mood and Affect: Mood normal.        Behavior: Behavior normal.     ED Results / Procedures / Treatments   Labs (all labs ordered are listed, but only abnormal results are displayed) Labs Reviewed - No data to display  EKG None  Radiology CT Head Wo Contrast  Result Date: 01/11/2021 CLINICAL DATA:  Fall.  Head injury. EXAM: CT HEAD WITHOUT CONTRAST TECHNIQUE: Contiguous axial images were obtained from the base of the skull through the vertex without intravenous contrast. COMPARISON:  CT head report only 12/19/2020. FINDINGS: Brain: Moderate atrophy. Patchy white matter hypodensity bilaterally. No intracranial hemorrhage or acute infarct 11 mm calcified dural base mass in the left frontal lobe with adjacent hyperostosis compatible with meningioma. No edema in the adjacent brain. Vascular: Negative for hyperdense vessel. Atherosclerotic calcification in the cavernous carotid bilaterally. Skull: Negative Sinuses/Orbits: Paranasal sinuses clear. Bilateral cataract extraction. Other: None IMPRESSION: Atrophy and chronic microvascular ischemic change.  No acute infarct 11 mm calcified meningioma left frontal lobe. This was described on the prior CT report 12/19/2020 Electronically Signed   By: Franchot Gallo M.D.   On: 01/11/2021 15:04    Procedures Procedures   Medications Ordered in ED Medications - No data to display  ED Course  I have reviewed the triage vital signs and the nursing notes.  Pertinent labs & imaging results that were available during my care of the patient were reviewed by me and considered in my medical decision making (see chart for details).    MDM Rules/Calculators/A&P                          Patient with history of dementia appears to be at neurologic baseline claims to have no pain.  Will get CT imaging and ambulate  the patient.  I discussed this with the family they agree to take the patient home with negative imaging and outpatient placement for their mother to memory care nursing facility.  Some chronic appearing wounds on the bilateral feet but no infectious signs or symptoms.  Family notes these are likely from a fall where she scraped her feet.  CT imaging reviewed by radiology myself with no acute traumatic findings.  Patient is able to ambulate at baseline with minimal assistance to the restroom.  I discussed this with  family.  They feel safe with discharge. Final Clinical Impression(s) / ED Diagnoses Final diagnoses:  Fall, initial encounter    Rx / DC Orders ED Discharge Orders    None       Breck Coons, MD 01/11/21 [phone]

## 2021-07-07 IMAGING — CT CT HEAD W/O CM
3 series · 14 of 47 positions shown, 16 images · non-contrast
Comparison: CT head report only 12/19/2020.

CLINICAL DATA: Fall.  Head injury.

EXAM:
CT HEAD WITHOUT CONTRAST
TECHNIQUE: Contiguous axial images were obtained from the base of the skull
through the vertex without intravenous contrast.

[Series 2: head wo · axial · 0.47mm/px · z∈[-105,+20]mm · 8 of 31 slices shown, 10 images]
[im 3/31  brain]
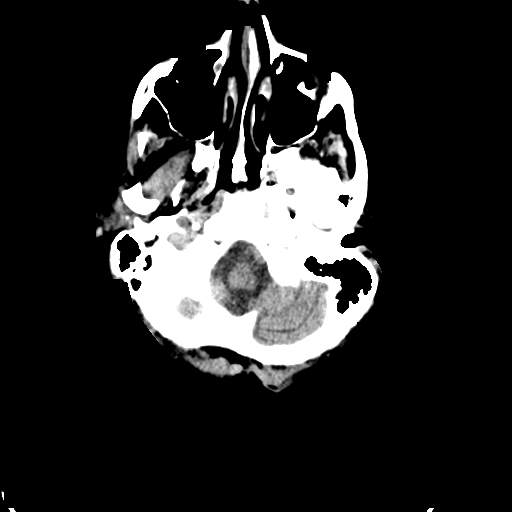
[im 3/31  bone]
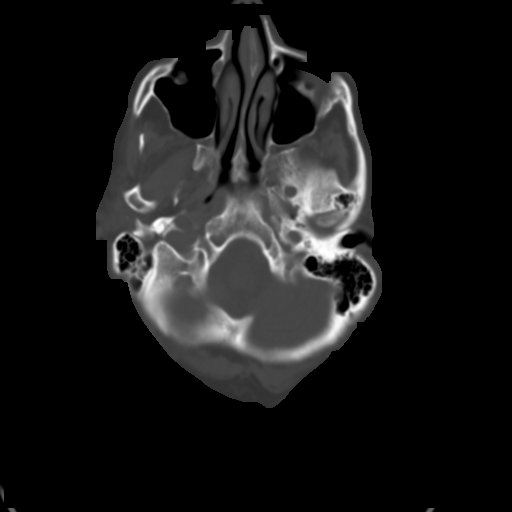
[im 7/31  brain]
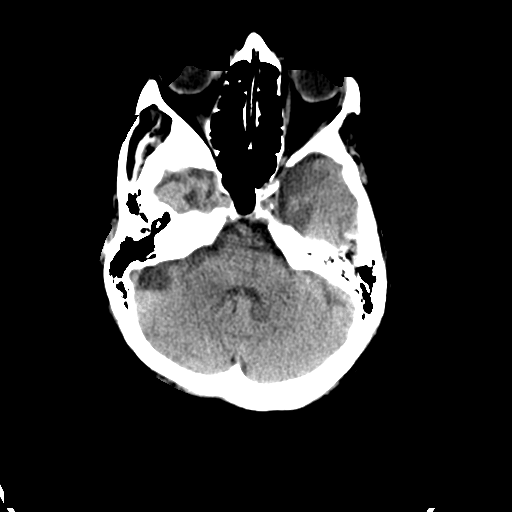
[im 10/31  brain]
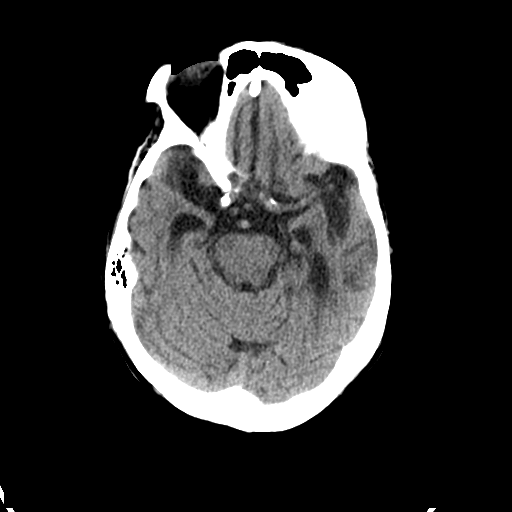
[im 14/31  brain]
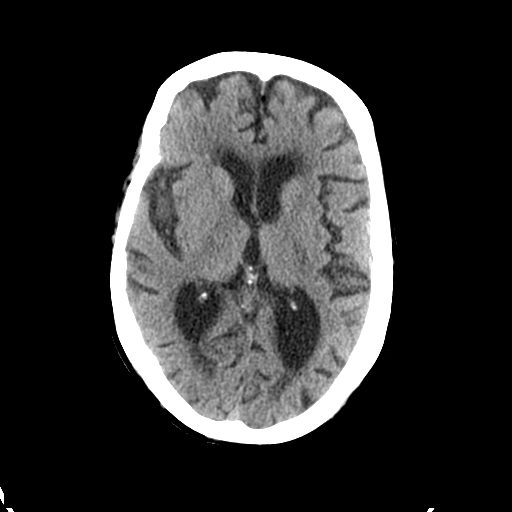
[im 17/31  brain]
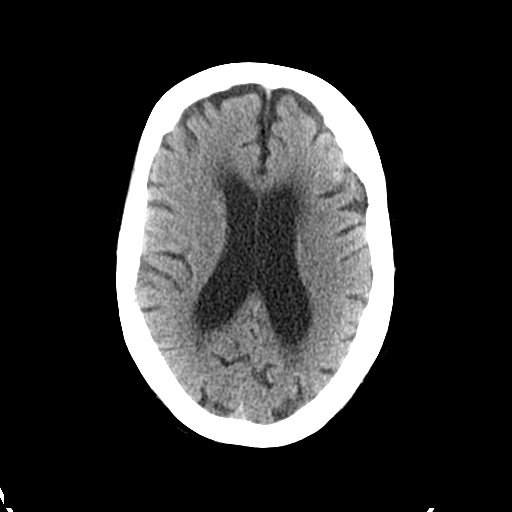
[im 17/31  bone]
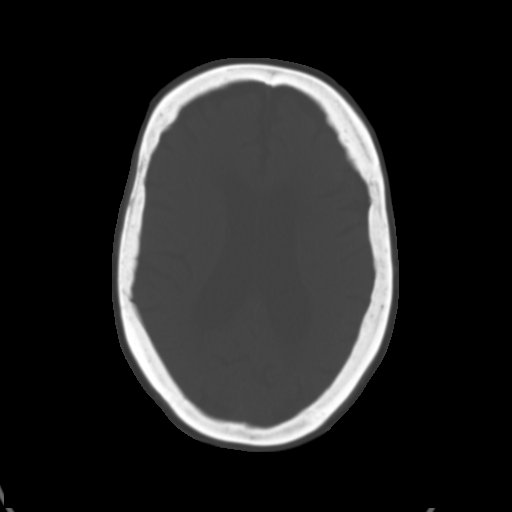
[im 21/31  brain]
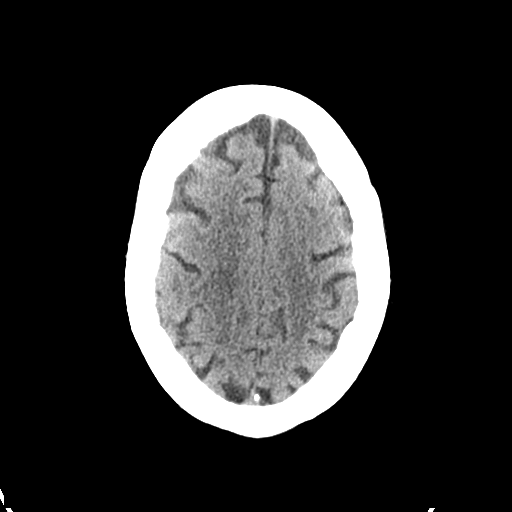
[im 24/31  brain]
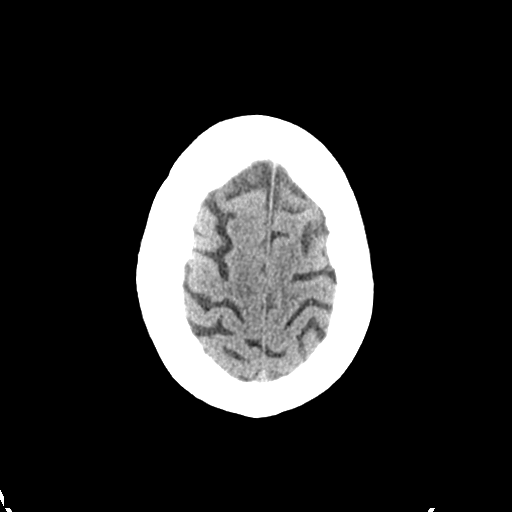
[im 28/31  brain]
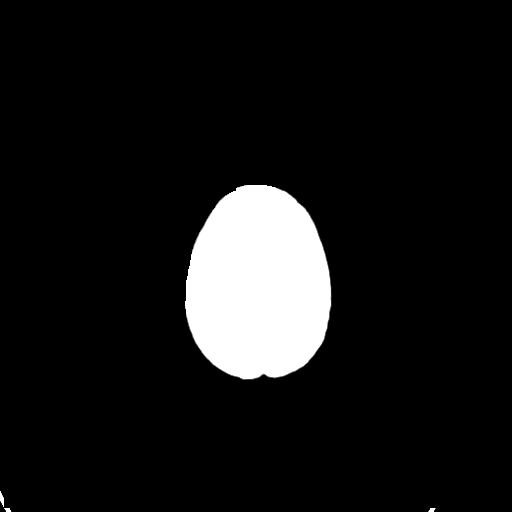

[Series 4: coronal soft tissue · coronal · 0.39mm/px · 3 of 65 slices shown]
[im 22/65  brain]
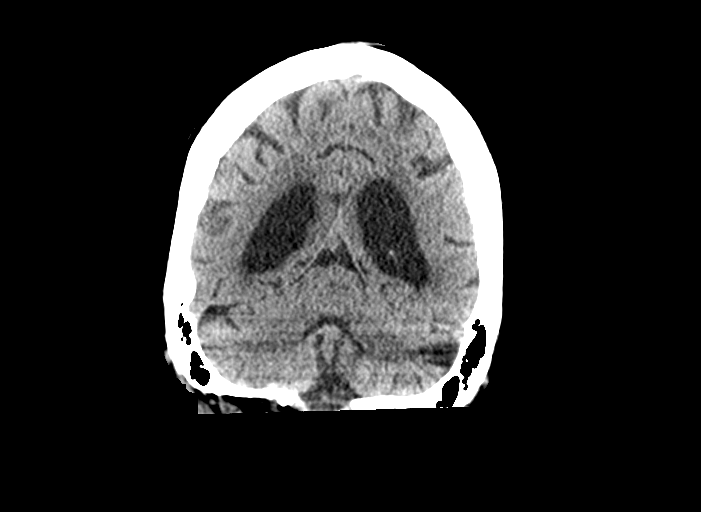
[im 29/65  brain]
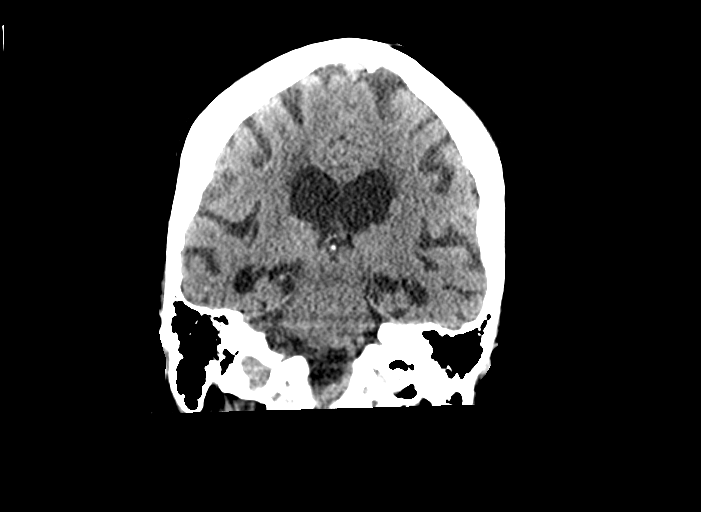
[im 36/65  brain]
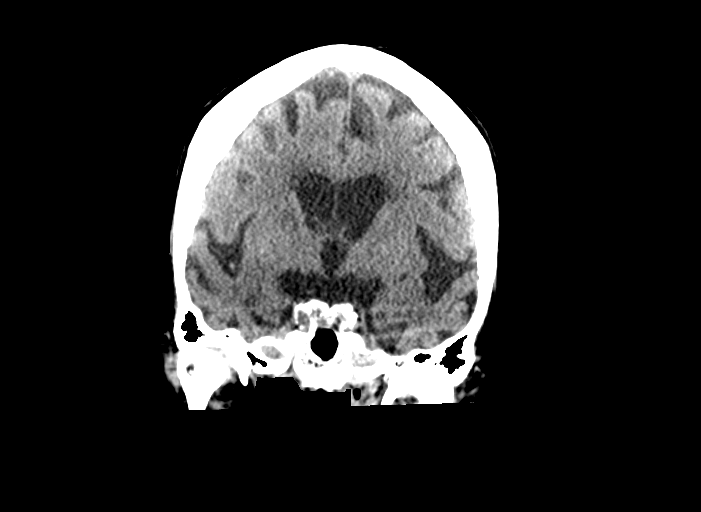

[Series 5: sagittal soft tissue · sagittal · 0.35mm/px · 3 of 44 slices shown]
[im 15/44  brain]
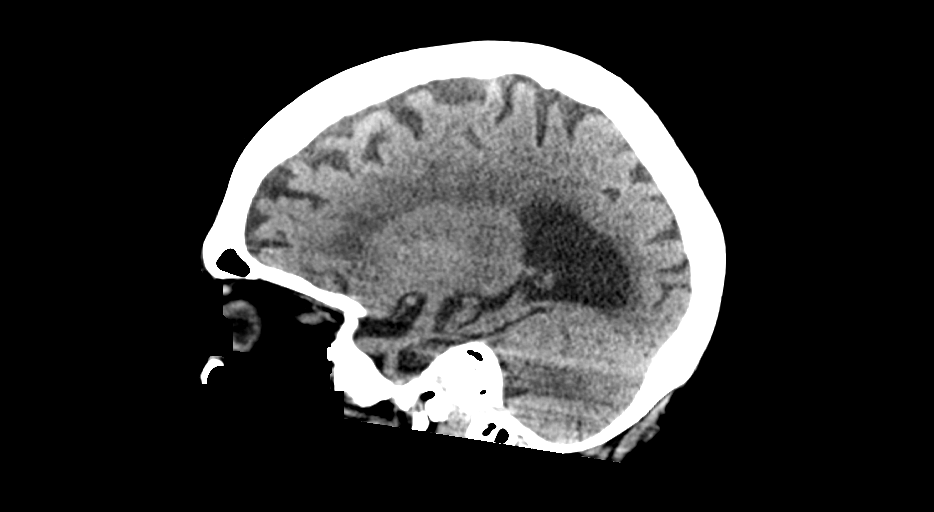
[im 22/44  brain]
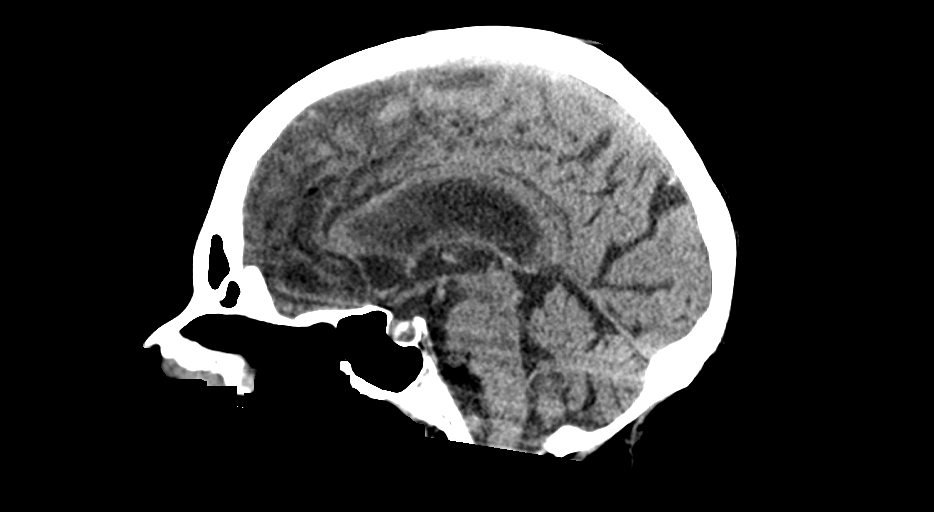
[im 29/44  brain]
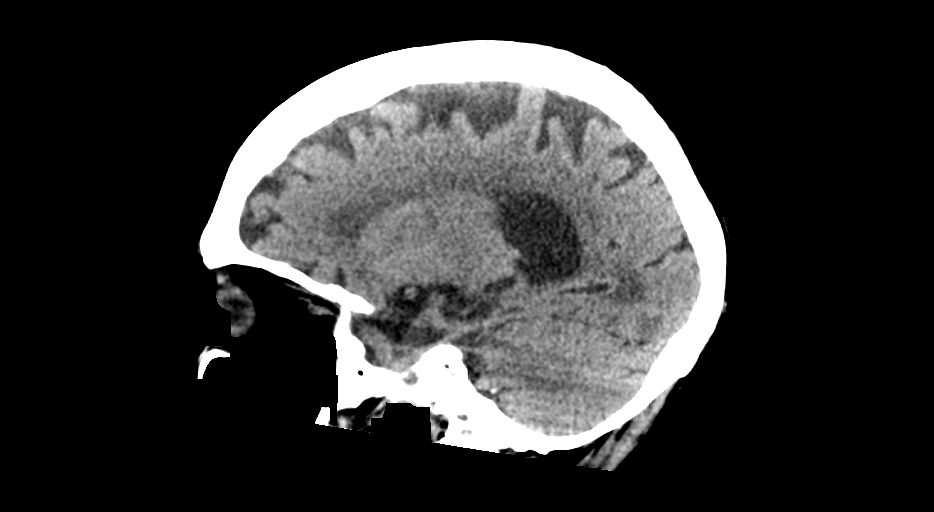

[14 of 47 positions shown; findings below may reference images not displayed]

FINDINGS: Brain: Moderate atrophy. Patchy white matter hypodensity
bilaterally. No intracranial hemorrhage or acute infarct

11 mm calcified dural base mass in the left frontal lobe with
adjacent hyperostosis compatible with meningioma. No edema in the
adjacent brain.

Vascular: Negative for hyperdense vessel. Atherosclerotic
calcification in the cavernous carotid bilaterally.

Skull: Negative

Sinuses/Orbits: Paranasal sinuses clear. Bilateral cataract
extraction.

Other: None
IMPRESSION: Atrophy and chronic microvascular ischemic change.  No acute infarct

11 mm calcified meningioma left frontal lobe. This was described on
the prior CT report 12/19/2020

## 2022-03-24 ENCOUNTER — Emergency Department (HOSPITAL_BASED_OUTPATIENT_CLINIC_OR_DEPARTMENT_OTHER)
Admission: EM | Admit: 2022-03-24 | Discharge: 2022-03-24 | Disposition: A | Discharge status: Home / Self Care | Payer: Medicare Other | Attending: Emergency Medicine | Admitting: Emergency Medicine

## 2022-03-24 ENCOUNTER — Other Ambulatory Visit: Payer: Self-pay

## 2022-03-24 ENCOUNTER — Emergency Department (HOSPITAL_BASED_OUTPATIENT_CLINIC_OR_DEPARTMENT_OTHER): Payer: Medicare Other

## 2022-03-24 ENCOUNTER — Emergency Department (HOSPITAL_BASED_OUTPATIENT_CLINIC_OR_DEPARTMENT_OTHER): Payer: Medicare Other | Admitting: Radiology

## 2022-03-24 ENCOUNTER — Encounter (HOSPITAL_BASED_OUTPATIENT_CLINIC_OR_DEPARTMENT_OTHER): Payer: Self-pay

## 2022-03-24 DIAGNOSIS — W19XXXA Unspecified fall, initial encounter: Secondary | ICD-10-CM | POA: Diagnosis not present

## 2022-03-24 DIAGNOSIS — F0390 Unspecified dementia without behavioral disturbance: Secondary | ICD-10-CM | POA: Insufficient documentation

## 2022-03-24 DIAGNOSIS — B029 Zoster without complications: Secondary | ICD-10-CM | POA: Diagnosis not present

## 2022-03-24 DIAGNOSIS — S0990XA Unspecified injury of head, initial encounter: Secondary | ICD-10-CM | POA: Diagnosis present

## 2022-03-24 DIAGNOSIS — M25552 Pain in left hip: Secondary | ICD-10-CM | POA: Diagnosis not present

## 2022-03-24 NOTE — ED Notes (Signed)
Pt had open case of shingles and examined by Md for same

## 2022-03-24 NOTE — ED Notes (Signed)
Attempted to call [facility] no one answered. Sent to answering machine and then it cut off

## 2022-03-24 NOTE — Discharge Instructions (Signed)
Patient was examined and x-rays obtained.  There is no evidence of acute injury or fracture noted on exam here in the emergency department.

## 2022-03-24 NOTE — ED Notes (Signed)
Pt taken back to facility by daughter. Pt able to stand with assistance and denied pain. [facility] stated that pt is baseline cognitively.

## 2022-03-24 NOTE — ED Triage Notes (Signed)
Staff at [facility] witnessed this demntia pt. "Miss the seat" upon her attempting to sit, thereby landing on the floor. Pt. Has no complaints. Staff theorize pt. Struck her head during the fall. I find no swelling, bleeding, nor any other evidence of head trauma per exam. She arrives with a rigid c-collar which we maintain. She is alert and confused per her usual mentation per her caregivers.

## 2022-03-24 NOTE — ED Notes (Signed)
Patient transported to X-ray 

## 2022-03-24 NOTE — ED Provider Notes (Signed)
[facility] Provider Note   CSN: [id number] Arrival date & time: 03/24/22  1641     History  Chief Complaint  Patient presents with   D[facility] Ali is a 88 y.o. female.  HPI Level 5 caveat secondary to dementia  88 year old female from dementia unit presents today with reports that she had a mechanical fall.  Reports that she was sitting down when she fell on the way to the ground.  They think that she may have struck her head during the fall.  No report of loss of consciousness.  Patient was transported here with collar in place.  She is reportedly at her baseline status.  She has no complaints to me on my exam    Home Medications Prior to Admission medications   Medication Sig Start Date End Date Taking? Authorizing Provider  Acetaminophen (TYLENOL ARTHRITIS PAIN PO) Take by mouth.    [provider]  Cholecalciferol (VITAMIN D3) 50 MCG (2000 UT) TABS TAKE ONE TABLET BY MOUTH AT BEDTIME 10/16/18   Pleas Koch, NP  donepezil (ARICEPT) 5 MG tablet TAKE ONE TABLET BY MOUTH AT BEDTIME FOR MEMORY 09/26/20   Pleas Koch, NP  escitalopram (LEXAPRO) 10 MG tablet TAKE 1 TABLET (10 MG TOTAL) BY MOUTH DAILY. FOR ANXIETY. 07/17/20   Pleas Koch, NP  levothyroxine (SYNTHROID) 25 MCG tablet TAKE ONE TABLET BY MOUTH IN THE MORNING ON AN EMPTY STOMACH WITH WATER, NO OTHER MEDS OR FOOD FOR 30 MINS 07/17/20   Pleas Koch, NP  pravastatin (PRAVACHOL) 40 MG tablet TAKE ONE TABLET BY MOUTH IN THE EVENING FOR CHOLESTEROL 11/01/20   Pleas Koch, NP      Allergies    Patient has no known allergies.    Review of Systems   Review of Systems  Physical Exam Updated Vital Signs BP 131/61   Pulse (!) 59   Temp 98.4 F (36.9 C) (Oral)   Resp 16   SpO2 98%  Physical Exam Vitals and nursing note reviewed.  Musculoskeletal:     Comments: Some pain with movement of left hip and palpation of left hip Cervical collar in  place No obvious signs of trauma to head No obvious signs of trauma to back, no point tenderness over thoracic or lumbar spine  Skin:    Comments: Rash on right flank to right back consistent with zoster  Neurological:     Mental Status: She is alert.     ED Results / Procedures / Treatments   Labs (all labs ordered are listed, but only abnormal results are displayed) Labs Reviewed - No data to display  EKG None  Radiology DG Hip Unilat W or Wo Pelvis 2-3 Views Left  Result Date: 03/24/2022 CLINICAL DATA:  Pain after fall EXAM: DG HIP (WITH OR WITHOUT PELVIS) 2-3V LEFT COMPARISON:  None Available. FINDINGS: There is no evidence of hip fracture or dislocation. Degenerative changes pubic symphysis, both hips, SI joints and lower lumbar spine. IMPRESSION: No acute fracture or dislocation. Electronically Signed   By: Placido Sou M.D.   On: 03/24/2022 17:58   CT Head Wo Contrast  Result Date: 03/24/2022 CLINICAL DATA:  Neck trauma (Age >= 65y); Head trauma, minor (Age >= 65y) EXAM: CT HEAD WITHOUT CONTRAST CT CERVICAL SPINE WITHOUT CONTRAST TECHNIQUE: Multidetector CT imaging of the head and cervical spine was performed following the standard protocol without intravenous contrast. Multiplanar CT image reconstructions of the cervical spine were  also generated. RADIATION DOSE REDUCTION: This exam was performed according to the departmental dose-optimization program which includes automated exposure control, adjustment of the mA and/or kV according to patient size and/or use of iterative reconstruction technique. COMPARISON:  01/11/2021 FINDINGS: CT HEAD FINDINGS Brain: No evidence of acute infarction, hemorrhage, hydrocephalus, or extra-axial collection. Stable calcified left frontal meningioma measuring approximately 13 x 12 mm. Patchy low-density changes within the periventricular and subcortical white matter compatible with chronic microvascular ischemic change. Moderate diffuse cerebral  volume loss. Vascular: Atherosclerotic calcifications involving the large vessels of the skull base. No unexpected hyperdense vessel. Skull: Normal. Negative for fracture or focal lesion. Sinuses/Orbits: No acute finding. Other: None. CT CERVICAL SPINE FINDINGS Alignment: Facet joints are aligned without dislocation or traumatic listhesis. Dens and lateral masses are aligned. Normal cervical lordosis. Skull base and vertebrae: No acute fracture. No primary bone lesion or focal pathologic process. Soft tissues and spinal canal: No prevertebral fluid or swelling. No visible canal hematoma. Disc levels: Disc height loss most pronounced at C6-7. Relatively mild facet joint arthropathy. Upper chest: Included lung apices are clear. Other: Bilateral carotid atherosclerosis. IMPRESSION: 1. No acute intracranial abnormality. 2. No acute cervical spine fracture or subluxation. 3. Chronic microvascular ischemic change and moderate diffuse cerebral volume loss. 4. Mild cervical spondylosis. Electronically Signed   By: Davina Poke D.O.   On: 03/24/2022 17:51   CT Cervical Spine Wo Contrast  Result Date: 03/24/2022 CLINICAL DATA:  Neck trauma (Age >= 65y); Head trauma, minor (Age >= 65y) EXAM: CT HEAD WITHOUT CONTRAST CT CERVICAL SPINE WITHOUT CONTRAST TECHNIQUE: Multidetector CT imaging of the head and cervical spine was performed following the standard protocol without intravenous contrast. Multiplanar CT image reconstructions of the cervical spine were also generated. RADIATION DOSE REDUCTION: This exam was performed according to the departmental dose-optimization program which includes automated exposure control, adjustment of the mA and/or kV according to patient size and/or use of iterative reconstruction technique. COMPARISON:  01/11/2021 FINDINGS: CT HEAD FINDINGS Brain: No evidence of acute infarction, hemorrhage, hydrocephalus, or extra-axial collection. Stable calcified left frontal meningioma measuring  approximately 13 x 12 mm. Patchy low-density changes within the periventricular and subcortical white matter compatible with chronic microvascular ischemic change. Moderate diffuse cerebral volume loss. Vascular: Atherosclerotic calcifications involving the large vessels of the skull base. No unexpected hyperdense vessel. Skull: Normal. Negative for fracture or focal lesion. Sinuses/Orbits: No acute finding. Other: None. CT CERVICAL SPINE FINDINGS Alignment: Facet joints are aligned without dislocation or traumatic listhesis. Dens and lateral masses are aligned. Normal cervical lordosis. Skull base and vertebrae: No acute fracture. No primary bone lesion or focal pathologic process. Soft tissues and spinal canal: No prevertebral fluid or swelling. No visible canal hematoma. Disc levels: Disc height loss most pronounced at C6-7. Relatively mild facet joint arthropathy. Upper chest: Included lung apices are clear. Other: Bilateral carotid atherosclerosis. IMPRESSION: 1. No acute intracranial abnormality. 2. No acute cervical spine fracture or subluxation. 3. Chronic microvascular ischemic change and moderate diffuse cerebral volume loss. 4. Mild cervical spondylosis. Electronically Signed   By: Davina Poke D.O.   On: 03/24/2022 17:51    Procedures Procedures    Medications Ordered in ED Medications - No data to display  ED Course/ Medical Decision Making/ A&P Clinical Course as of 03/24/22 1807  Sun Mar 24, 2022  1805 Left hip x-Ivi Griffith reviewed interpreted without evidence of acute abnormality abnormality radiologist interpretation concurs [DR]  1806 CT head and neck without any evidence of acute abnormality  these were personally reviewed and interpreted and radiologist interpretation concurs [DR]    Clinical Course User Index [DR] Pattricia Boss, MD                           Medical Decision Making 88 year old female who fell today.  Facility stated that she may have hit her head. On my exam she  seemed to indicate that she had some hip pain Urology exams obtained including head and neck CT and left hip without any evidence of acute abnormality Patient has remained hemodynamically stable and appears stable for discharge back to facility  Amount and/or Complexity of Data Reviewed Radiology: ordered and independent interpretation performed. Decision-making details documented in ED Course.           Final Clinical Impression(s) / ED Diagnoses Final diagnoses:  Fall, initial encounter    Rx / DC Orders ED Discharge Orders     None         Pattricia Boss, MD 03/24/22 1807

## 2023-01-14 ENCOUNTER — Encounter (HOSPITAL_COMMUNITY): Payer: Self-pay

## 2023-01-14 ENCOUNTER — Other Ambulatory Visit: Payer: Self-pay

## 2023-01-14 ENCOUNTER — Emergency Department (HOSPITAL_COMMUNITY)
Admission: EM | Admit: 2023-01-14 | Discharge: 2023-01-14 | Disposition: A | Discharge status: Skilled Nursing Facility | Payer: Medicare Other | Attending: Emergency Medicine | Admitting: Emergency Medicine

## 2023-01-14 DIAGNOSIS — F03C0 Unspecified dementia, severe, without behavioral disturbance, psychotic disturbance, mood disturbance, and anxiety: Secondary | ICD-10-CM | POA: Insufficient documentation

## 2023-01-14 DIAGNOSIS — W19XXXA Unspecified fall, initial encounter: Secondary | ICD-10-CM | POA: Diagnosis not present

## 2023-01-14 NOTE — ED Provider Notes (Signed)
[facility] EMERGENCY DEPARTMENT AT [facility] Provider Note   CSN: [id number] Arrival date & time: 01/14/23  1953     History  Chief Complaint  Patient presents with   D[facility] Ali is a 89 y.o. female.  HPI Patient is from [facility].  Reportedly she fell but it was unwitnessed.  However the patient stood back up and ambulated after the fall without significant difficulty.  No injuries or area of pain were identified.  Patient's daughter reports that the nursing facility advised that the patient had a low blood pressure and they were concerned about that.  There has not been any report of fever or other behavioral change.  Patient's daughter reports that she is at baseline.  Patient is pleasant but clearly has cognitive impairment.    Home Medications Prior to Admission medications   Medication Sig Start Date End Date Taking? Authorizing Provider  Acetaminophen (TYLENOL ARTHRITIS PAIN PO) Take by mouth.    [provider]  Cholecalciferol (VITAMIN D3) 50 MCG (2000 UT) TABS TAKE ONE TABLET BY MOUTH AT BEDTIME 10/16/18   Doreene Nest, NP  donepezil (ARICEPT) 5 MG tablet TAKE ONE TABLET BY MOUTH AT BEDTIME FOR MEMORY 09/26/20   Doreene Nest, NP  escitalopram (LEXAPRO) 10 MG tablet TAKE 1 TABLET (10 MG TOTAL) BY MOUTH DAILY. FOR ANXIETY. 07/17/20   Doreene Nest, NP  levothyroxine (SYNTHROID) 25 MCG tablet TAKE ONE TABLET BY MOUTH IN THE MORNING ON AN EMPTY STOMACH WITH WATER, NO OTHER MEDS OR FOOD FOR 30 MINS 07/17/20   Doreene Nest, NP  pravastatin (PRAVACHOL) 40 MG tablet TAKE ONE TABLET BY MOUTH IN THE EVENING FOR CHOLESTEROL 11/01/20   Doreene Nest, NP      Allergies    Patient has no known allergies.    Review of Systems   Review of Systems  Physical Exam Updated Vital Signs BP 95/60   Pulse 79   Temp 97.7 F (36.5 C)   Resp 15   SpO2 98%  Physical Exam Constitutional:      Comments: Patient is alert and  cheerful.  No respiratory distress.  HENT:     Head: Normocephalic and atraumatic.     Nose: Nose normal.     Mouth/Throat:     Mouth: Mucous membranes are moist.     Pharynx: Oropharynx is clear.  Eyes:     Extraocular Movements: Extraocular movements intact.  Neck:     Comments: No midline C-spine tenderness to palpation. Cardiovascular:     Rate and Rhythm: Normal rate and regular rhythm.  Pulmonary:     Effort: Pulmonary effort is normal.     Breath sounds: Normal breath sounds.  Abdominal:     General: There is no distension.     Palpations: Abdomen is soft.     Tenderness: There is no abdominal tenderness. There is no guarding.  Musculoskeletal:        General: No swelling, tenderness, deformity or signs of injury. Normal range of motion.     Right lower leg: No edema.     Left lower leg: No edema.     Comments: No visible significant injury to extremities.  I can put both upper and lower extremities through range of motion and patient does not object or express pain.  Skin:    General: Skin is warm and dry.  Neurological:     Comments: Patient is alert.  She is  interacting verbally.  She has cognitive impairment for any recall.  She does assist in following commands but requires a lot of prompting.  No focal motor deficits identified.     ED Results / Procedures / Treatments   Labs (all labs ordered are listed, but only abnormal results are displayed) Labs Reviewed - No data to display  EKG None  Radiology No results found.  Procedures Procedures    Medications Ordered in ED Medications - No data to display  ED Course/ Medical Decision Making/ A&P                             Medical Decision Making  Patient is brought for fall unwitnessed.  She is alert and interactive.  Does have significant baseline dementia and is in memory care.  I can perform range of motion 4 extremities no evidence of significant injury.  Patient's daughter reports that they noted  low blood pressure.  EMS blood pressure was 110/70.  Blood pressure in the ED 96/60.  Clinically patient does not appear ill.  She does not appear clinically dehydrated.  Afebrile.  Patient does not have any history of hypertension.  At this time I do not feel that she needs further diagnostic imaging or lab work without any additional symptoms and well appearance.  Discussed with the daughter encouraging oral hydration and fluid intake, observing for any signs of other injury or pain and return for recheck if any concerning symptoms develop.  She voiced understanding and agrees with plan.        Final Clinical Impression(s) / ED Diagnoses Final diagnoses:  Fall, initial encounter  Severe dementia, unspecified dementia type, unspecified whether behavioral, psychotic, or mood disturbance or anxiety [facility])    Rx / DC Orders ED Discharge Orders     None         Arby Barrette, MD 01/14/23 2219

## 2023-01-14 NOTE — Discharge Instructions (Signed)
1.  At this time there is no evident injury.  Continue to observe for any areas of pain or disuse.  If you identify bruises you may apply ice packs and give Tylenol for pain.  If there are areas of significant pain or disused return for recheck. 2.  Try to encourage fluids and hydration and small nutritious snacks.  Observe for any signs of illness such as fever or pain.  Follow-up with your doctor for recheck.  Return to the emergency department if you have concerning changes you observe.

## 2023-01-14 NOTE — ED Notes (Signed)
E-signature signed by nurse due to pt being confused. RN was not able to put in the box on why pt couldn't sign themselves.

## 2023-01-14 NOTE — ED Triage Notes (Signed)
Pt BIB EMS from [facility] for unwitnessed fall. Pt was able to stand and ambulate w/o pain after the fall. No injuries or deformities present.  Hx dementia  110/70 70 HR 97% RA 132 cbg

## 2023-01-14 NOTE — ED Notes (Signed)
RN opened blinds on the door, put on bed alarm, and laid pts bed in trendelenburg position.

## 2023-01-14 NOTE — ED Notes (Addendum)
Pt daughter insisted on taking pt back to nursing facility and did not want to wait on transportation. Memory care called to give update on pt discharge.

## 2023-01-15 ENCOUNTER — Telehealth: Payer: Self-pay

## 2023-01-15 NOTE — Transitions of Care (Post Inpatient/ED Visit) (Signed)
   01/15/2023  Name: LOVELY Ali MRN: [id number] DOB: 10-May-1934  Today's TOC FU Call Status: Today's TOC FU Call Status:: Successful TOC FU Call Competed TOC FU Call Complete Date: 01/15/23  Transition Care Management Follow-up Telephone Call Date of Discharge: 01/14/23 Discharge Facility: [facility]) Type of Discharge: Emergency Department Reason for ED Visit: Other: (Fall,) How have you been since you were released from the hospital?: Better Any questions or concerns?: No  Items Reviewed: Did you receive and understand the discharge instructions provided?: Yes Medications obtained,verified, and reconciled?: Yes (Medications Reviewed) Any new allergies since your discharge?: No Dietary orders reviewed?: Yes Do you have support at home?: Yes  Medications Reviewed Today: Medications Reviewed Today     Reviewed by Merleen Nicely, LPN (Licensed Practical Nurse) on 01/15/23 at 1436  Med List Status: <None>   Medication Order Taking? Sig Documenting Provider Last Dose Status Informant  Acetaminophen (TYLENOL ARTHRITIS PAIN PO) [id number] Yes Take by mouth. [provider] Taking Active   Cholecalciferol (VITAMIN D3) 50 MCG (2000 UT) TABS [id number] Yes TAKE ONE TABLET BY MOUTH AT BEDTIME Doreene Nest, NP Taking Active   donepezil (ARICEPT) 5 MG tablet [id number] Yes TAKE ONE TABLET BY MOUTH AT BEDTIME FOR MEMORY Doreene Nest, NP Taking Active   escitalopram (LEXAPRO) 10 MG tablet [id number] Yes TAKE 1 TABLET (10 MG TOTAL) BY MOUTH DAILY. FOR ANXIETY. Doreene Nest, NP Taking Active   levothyroxine (SYNTHROID) 25 MCG tablet [id number] Yes TAKE ONE TABLET BY MOUTH IN THE MORNING ON AN EMPTY STOMACH WITH WATER, NO OTHER MEDS OR FOOD FOR 30 MINS Doreene Nest, NP Taking Active   pravastatin (PRAVACHOL) 40 MG tablet [id number] Yes TAKE ONE TABLET BY MOUTH IN THE EVENING FOR CHOLESTEROL Doreene Nest, NP Taking Active             Home Care and  Equipment/Supplies: Were Home Health Services Ordered?: No Any new equipment or medical supplies ordered?: No  Functional Questionnaire: Do you need assistance with bathing/showering or dressing?: No Do you need assistance with meal preparation?: No Do you need assistance with eating?: No Do you have difficulty maintaining continence: No Do you need assistance with getting out of bed/getting out of a chair/moving?: No Do you have difficulty managing or taking your medications?: No  Follow up appointments reviewed: PCP Follow-up appointment confirmed?: No (refused) MD Provider Line Number:[phone] Given: Yes Specialist Hospital Follow-up appointment confirmed?: No Do you need transportation to your follow-up appointment?: No Do you understand care options if your condition(s) worsen?: Yes-patient verbalized understanding    SIGNATURE  Woodfin Ganja LPN [facility] Nurse Health Advisor Direct Dial [phone]

## 2023-04-28 ENCOUNTER — Emergency Department (HOSPITAL_COMMUNITY)
Admission: EM | Admit: 2023-04-28 | Discharge: 2023-04-28 | Disposition: A | Discharge status: Home / Self Care | Payer: Medicare Other | Attending: Emergency Medicine | Admitting: Emergency Medicine

## 2023-04-28 ENCOUNTER — Encounter (HOSPITAL_COMMUNITY): Payer: Self-pay

## 2023-04-28 ENCOUNTER — Other Ambulatory Visit: Payer: Self-pay

## 2023-04-28 ENCOUNTER — Emergency Department (HOSPITAL_COMMUNITY): Payer: Medicare Other

## 2023-04-28 DIAGNOSIS — W19XXXA Unspecified fall, initial encounter: Secondary | ICD-10-CM | POA: Diagnosis not present

## 2023-04-28 DIAGNOSIS — R451 Restlessness and agitation: Secondary | ICD-10-CM | POA: Diagnosis not present

## 2023-04-28 DIAGNOSIS — R410 Disorientation, unspecified: Secondary | ICD-10-CM | POA: Insufficient documentation

## 2023-04-28 DIAGNOSIS — F0390 Unspecified dementia without behavioral disturbance: Secondary | ICD-10-CM | POA: Diagnosis not present

## 2023-04-28 DIAGNOSIS — D649 Anemia, unspecified: Secondary | ICD-10-CM | POA: Diagnosis not present

## 2023-04-28 LAB — CBC WITH DIFFERENTIAL/PLATELET
Abs Immature Granulocytes: 0 10*3/uL (ref 0.00–0.07)
Basophils Absolute: 0 10*3/uL (ref 0.0–0.1)
Basophils Relative: 0 %
Eosinophils Absolute: 0 10*3/uL (ref 0.0–0.5)
Eosinophils Relative: 1 %
HCT: 30.1 % — ABNORMAL LOW (ref 36.0–46.0)
Hemoglobin: 9.6 g/dL — ABNORMAL LOW (ref 12.0–15.0)
Immature Granulocytes: 0 %
Lymphocytes Relative: 30 %
Lymphs Abs: 1.5 10*3/uL (ref 0.7–4.0)
MCH: 33.6 pg (ref 26.0–34.0)
MCHC: 31.9 g/dL (ref 30.0–36.0)
MCV: 105.2 fL — ABNORMAL HIGH (ref 80.0–100.0)
Monocytes Absolute: 0.5 10*3/uL (ref 0.1–1.0)
Monocytes Relative: 10 %
Neutro Abs: 2.9 10*3/uL (ref 1.7–7.7)
Neutrophils Relative %: 59 %
Platelets: 185 10*3/uL (ref 150–400)
RBC: 2.86 MIL/uL — ABNORMAL LOW (ref 3.87–5.11)
RDW: 12.3 % (ref 11.5–15.5)
WBC: 5 10*3/uL (ref 4.0–10.5)
nRBC: 0 % (ref 0.0–0.2)

## 2023-04-28 LAB — COMPREHENSIVE METABOLIC PANEL
ALT: 15 U/L (ref 0–44)
AST: 22 U/L (ref 15–41)
Albumin: 3.7 g/dL (ref 3.5–5.0)
Alkaline Phosphatase: 49 U/L (ref 38–126)
Anion gap: 8 (ref 5–15)
BUN: 33 mg/dL — ABNORMAL HIGH (ref 8–23)
CO2: 23 mmol/L (ref 22–32)
Calcium: 9.1 mg/dL (ref 8.9–10.3)
Chloride: 108 mmol/L (ref 98–111)
Creatinine, Ser: 1.3 mg/dL — ABNORMAL HIGH (ref 0.44–1.00)
GFR, Estimated: 40 mL/min — ABNORMAL LOW (ref >60)
Glucose, Bld: 131 mg/dL — ABNORMAL HIGH (ref 70–99)
Potassium: 4.1 mmol/L (ref 3.5–5.1)
Sodium: 139 mmol/L (ref 135–145)
Total Bilirubin: 0.6 mg/dL (ref 0.3–1.2)
Total Protein: 6.7 g/dL (ref 6.5–8.1)

## 2023-04-28 LAB — URINALYSIS, W/ REFLEX TO CULTURE (INFECTION SUSPECTED)
Bacteria, UA: NONE SEEN
Bilirubin Urine: NEGATIVE
Glucose, UA: NEGATIVE mg/dL
Hgb urine dipstick: NEGATIVE
Ketones, ur: NEGATIVE mg/dL
Leukocytes,Ua: NEGATIVE
Nitrite: NEGATIVE
Protein, ur: NEGATIVE mg/dL
Specific Gravity, Urine: 1.015 (ref 1.005–1.030)
pH: 7 (ref 5.0–8.0)

## 2023-04-28 LAB — TROPONIN I (HIGH SENSITIVITY): Troponin I (High Sensitivity): 10 ng/L (ref <18)

## 2023-04-28 LAB — POC OCCULT BLOOD, ED: Fecal Occult Bld: NEGATIVE

## 2023-04-28 MED ORDER — LACTATED RINGERS IV BOLUS
1000.0000 mL | Freq: Once | INTRAVENOUS | Status: AC
  Administered 2023-04-28: 1000 mL via INTRAVENOUS

## 2023-04-28 MED ORDER — MIDAZOLAM HCL 2 MG/2ML IJ SOLN
1.0000 mg | Freq: Once | INTRAMUSCULAR | Status: AC
  Administered 2023-04-28: 1 mg via INTRAVENOUS
  Filled 2023-04-28: qty 2

## 2023-04-28 MED ORDER — SODIUM CHLORIDE 0.9 % IV SOLN: INTRAVENOUS | Status: DC

## 2023-04-28 MED ORDER — HALOPERIDOL LACTATE 5 MG/ML IJ SOLN
2.0000 mg | Freq: Once | INTRAMUSCULAR | Status: AC
  Administered 2023-04-28: 2 mg via INTRAVENOUS
  Filled 2023-04-28: qty 1

## 2023-04-28 NOTE — ED Provider Notes (Signed)
[place] EMERGENCY DEPARTMENT AT [facility] Provider Note   CSN: [id number] Arrival date & time: 04/28/23  1600     History  Chief Complaint  Patient presents with   Fall    Diane Ali is a 89 y.o. female.  HPI 89 year old female with a history of dementia presents with a fall. History is from EMS. Facility called due to an unwitnessed fall, and she was found lying on her back. Initial BP was in the 90s, and she received 500 cc IV fluid from EMS. BP now in low 100s. Has a hematoma to the back of her head. Is at her normal mental status per the facility. She has had "increased falls" though it's unclear over what time frame. I called the nursing facility [facility]) but was unable to get in touch with a nurse.  Home Medications Prior to Admission medications   Medication Sig Start Date End Date Taking? Authorizing Provider  Acetaminophen (TYLENOL ARTHRITIS PAIN PO) Take by mouth.    [provider]  Cholecalciferol (VITAMIN D3) 50 MCG (2000 UT) TABS TAKE ONE TABLET BY MOUTH AT BEDTIME 10/16/18   Doreene Nest, NP  donepezil (ARICEPT) 5 MG tablet TAKE ONE TABLET BY MOUTH AT BEDTIME FOR MEMORY 09/26/20   Doreene Nest, NP  escitalopram (LEXAPRO) 10 MG tablet TAKE 1 TABLET (10 MG TOTAL) BY MOUTH DAILY. FOR ANXIETY. 07/17/20   Doreene Nest, NP  levothyroxine (SYNTHROID) 25 MCG tablet TAKE ONE TABLET BY MOUTH IN THE MORNING ON AN EMPTY STOMACH WITH WATER, NO OTHER MEDS OR FOOD FOR 30 MINS 07/17/20   Doreene Nest, NP  pravastatin (PRAVACHOL) 40 MG tablet TAKE ONE TABLET BY MOUTH IN THE EVENING FOR CHOLESTEROL 11/01/20   Doreene Nest, NP      Allergies    Patient has no known allergies.    Review of Systems   Review of Systems  Unable to perform ROS: Dementia    Physical Exam Updated Vital Signs BP (!) 147/81   Pulse 77   Temp 97.7 F (36.5 C)   Resp 20   Ht 5\' 4"  (1.626 m)   Wt 73.7 kg   SpO2 99%   BMI 27.89  kg/m  Physical Exam Vitals and nursing note reviewed.  Constitutional:      Appearance: She is well-developed.     Comments: No overt signs of trauma  HENT:     Head: Normocephalic and atraumatic.  Cardiovascular:     Rate and Rhythm: Normal rate and regular rhythm.     Heart sounds: Normal heart sounds.  Pulmonary:     Effort: Pulmonary effort is normal.     Breath sounds: Normal breath sounds.  Abdominal:     General: There is no distension.     Palpations: Abdomen is soft.     Tenderness: There is no abdominal tenderness.  Musculoskeletal:     Cervical back: No rigidity or tenderness.     Thoracic back: No tenderness.     Lumbar back: No tenderness.     Right hip: Normal range of motion.     Left hip: Normal range of motion.  Skin:    General: Skin is warm and dry.  Neurological:     Mental Status: She is alert.     Comments: Patient is disoriented and mildly agitated Doesn't follow too many commands. Seems to have equal strength in all 4 extremities     ED Results /  Procedures / Treatments   Labs (all labs ordered are listed, but only abnormal results are displayed) Labs Reviewed  CBC WITH DIFFERENTIAL/PLATELET - Abnormal; Notable for the following components:      Result Value   RBC 2.86 (*)    Hemoglobin 9.6 (*)    HCT 30.1 (*)    MCV 105.2 (*)    All other components within normal limits  COMPREHENSIVE METABOLIC PANEL - Abnormal; Notable for the following components:   Glucose, Bld 131 (*)    BUN 33 (*)    Creatinine, Ser 1.30 (*)    GFR, Estimated 40 (*)    All other components within normal limits  URINALYSIS, W/ REFLEX TO CULTURE (INFECTION SUSPECTED) - Abnormal; Notable for the following components:   Color, Urine STRAW (*)    All other components within normal limits  POC OCCULT BLOOD, ED  TROPONIN I (HIGH SENSITIVITY)    EKG EKG Interpretation Date/Time:  Monday April 28 2023 16:15:16 EDT Ventricular Rate:  174 PR Interval:    QRS  Duration:  92 QT Interval:  282 QTC Calculation: 469 R Axis:   -4  Text Interpretation: undetermined rhythm due to artifact Nonspecific T abnormalities, diffuse leads Baseline wander in lead(s) V2 Interpretation limited secondary to artifact Confirmed by Pricilla Loveless [phone]) on 04/28/2023 4:20:04 PM  Radiology CT Head Wo Contrast  Result Date: 04/28/2023 CLINICAL DATA:  Neck trauma.  Fall and confusion EXAM: CT HEAD WITHOUT CONTRAST CT CERVICAL SPINE WITHOUT CONTRAST TECHNIQUE: Multidetector CT imaging of the head and cervical spine was performed following the standard protocol without intravenous contrast. Multiplanar CT image reconstructions of the cervical spine were also generated. RADIATION DOSE REDUCTION: This exam was performed according to the departmental dose-optimization program which includes automated exposure control, adjustment of the mA and/or kV according to patient size and/or use of iterative reconstruction technique. COMPARISON:  03/24/2022 FINDINGS: CT HEAD FINDINGS Brain: No evidence of acute infarction, hemorrhage, hydrocephalus, extra-axial collection or mass effect. Brain atrophy, pronounced in the anterior temporal lobes. Extensive chronic small vessel ischemia in the cerebral white matter. Calcified meningioma along the left frontal convexity measuring 11 mm. Vascular: No hyperdense vessel or unexpected calcification. Skull: Normal. Negative for fracture or focal lesion. Sinuses/Orbits: No acute finding. CT CERVICAL SPINE FINDINGS Alignment: Normal. Skull base and vertebrae: No acute fracture. No primary bone lesion or focal pathologic process. Soft tissues and spinal canal: No prevertebral fluid or swelling. No visible canal hematoma. Disc levels:  Generalized degenerative endplate and facet spurring. Upper chest: Negative IMPRESSION: 1. No evidence of acute intracranial or cervical spine injury. 2. Advanced brain atrophy. Electronically Signed   By: Tiburcio Pea M.D.   On:  04/28/2023 18:22   CT Cervical Spine Wo Contrast  Result Date: 04/28/2023 CLINICAL DATA:  Neck trauma.  Fall and confusion EXAM: CT HEAD WITHOUT CONTRAST CT CERVICAL SPINE WITHOUT CONTRAST TECHNIQUE: Multidetector CT imaging of the head and cervical spine was performed following the standard protocol without intravenous contrast. Multiplanar CT image reconstructions of the cervical spine were also generated. RADIATION DOSE REDUCTION: This exam was performed according to the departmental dose-optimization program which includes automated exposure control, adjustment of the mA and/or kV according to patient size and/or use of iterative reconstruction technique. COMPARISON:  03/24/2022 FINDINGS: CT HEAD FINDINGS Brain: No evidence of acute infarction, hemorrhage, hydrocephalus, extra-axial collection or mass effect. Brain atrophy, pronounced in the anterior temporal lobes. Extensive chronic small vessel ischemia in the cerebral white matter. Calcified meningioma along the  left frontal convexity measuring 11 mm. Vascular: No hyperdense vessel or unexpected calcification. Skull: Normal. Negative for fracture or focal lesion. Sinuses/Orbits: No acute finding. CT CERVICAL SPINE FINDINGS Alignment: Normal. Skull base and vertebrae: No acute fracture. No primary bone lesion or focal pathologic process. Soft tissues and spinal canal: No prevertebral fluid or swelling. No visible canal hematoma. Disc levels:  Generalized degenerative endplate and facet spurring. Upper chest: Negative IMPRESSION: 1. No evidence of acute intracranial or cervical spine injury. 2. Advanced brain atrophy. Electronically Signed   By: Tiburcio Pea M.D.   On: 04/28/2023 18:22   DG Chest Portable 1 View  Result Date: 04/28/2023 CLINICAL DATA:  Weakness.  Fall. EXAM: PORTABLE CHEST 1 VIEW COMPARISON:  None Available. FINDINGS: Patient rotation. Normal heart size and mediastinal contours for technique. No pneumothorax, large pleural effusion  or focal airspace opacity. No pulmonary edema. On limited assessment, no acute osseous finding. IMPRESSION: No acute chest findings. Electronically Signed   By: Narda Rutherford M.D.   On: 04/28/2023 17:48    Procedures Procedures   Medications Ordered in ED Medications  0.9 %  sodium chloride infusion ( Intravenous Not Given 04/28/23 2209)  lactated ringers bolus 1,000 mL (0 mLs Intravenous Stopped 04/28/23 1954)  haloperidol lactate (HALDOL) injection 2 mg (2 mg Intravenous Given 04/28/23 1725)  midazolam (VERSED) injection 1 mg (1 mg Intravenous Given 04/28/23 1745)    ED Course/ Medical Decision Making/ A&P                                 Medical Decision Making Amount and/or Complexity of Data Reviewed External Data Reviewed: labs and notes.    Details: Last hemoglobin 10 Labs: ordered.    Details: Anemia, Hgb 9.8 Radiology: ordered and independent interpretation performed.    Details: No head bleed ECG/medicine tests: ordered and independent interpretation performed.    Details: Sinus rhythm  Risk Prescription drug management.   Patient presents after a fall.  Initial workup is overall unremarkable besides some anemia though this is not too far off baseline cording to outside records.  Eventually daughter arrived and she has had issues with following for quite some time, primarily due to dementia and not realizing she is not in a safe place to try and sit down.  Head CT is unremarkable.  Other labs including her urine are unremarkable.  She has had some agitation and required some treatment in the ED to help prevent injury to herself and for medical workup.  However her daughter states this happens to her every time she goes to the ER and she is otherwise acting at her baseline.  At this point, appears stable for discharge back to her facility.        Final Clinical Impression(s) / ED Diagnoses Final diagnoses:  Fall, initial encounter  Anemia, unspecified type    Rx /  DC Orders ED Discharge Orders     None         Pricilla Loveless, MD 04/28/23 2304

## 2023-04-28 NOTE — ED Notes (Signed)
Patient transported to CT 

## 2023-04-28 NOTE — ED Triage Notes (Signed)
BIBA for unwitnessed fall at facility. No blood thinners. No LOC. Patient usually walks with a walker. Hx of dementia Aa0x1 at baseline

## 2023-04-28 NOTE — ED Notes (Signed)
Purewick replaced at this time as patient despite mittens was able to remove it from between her legs. Incontinence brief continues to be dry at this time.

## 2023-04-28 NOTE — Discharge Instructions (Signed)
Fortunately, there were no significant injuries from today's fall.  However we did note that your hemoglobin is a little lower than baseline today.  Get this rechecked with your primary care doctor.  Otherwise, if there are any other new or concerning symptoms and return to the ER.
